# Patient Record
Sex: Female | Born: 1935 | Race: White | Hispanic: No | Smoking: Former smoker
Health system: Southern US, Community
[De-identification: ages and names within clinical notes are randomized; demographics above are authoritative.]

## PROBLEM LIST (undated history)

## (undated) DIAGNOSIS — N179 Acute kidney failure, unspecified: Secondary | ICD-10-CM

## (undated) DIAGNOSIS — I10 Essential (primary) hypertension: Secondary | ICD-10-CM

## (undated) DIAGNOSIS — N189 Chronic kidney disease, unspecified: Secondary | ICD-10-CM

## (undated) DIAGNOSIS — G309 Alzheimer's disease, unspecified: Secondary | ICD-10-CM

## (undated) DIAGNOSIS — K469 Unspecified abdominal hernia without obstruction or gangrene: Secondary | ICD-10-CM

## (undated) DIAGNOSIS — J189 Pneumonia, unspecified organism: Secondary | ICD-10-CM

## (undated) DIAGNOSIS — M109 Gout, unspecified: Secondary | ICD-10-CM

## (undated) DIAGNOSIS — F0280 Dementia in other diseases classified elsewhere without behavioral disturbance: Secondary | ICD-10-CM

## (undated) DIAGNOSIS — R000 Tachycardia, unspecified: Secondary | ICD-10-CM

## (undated) HISTORY — PX: COLON SURGERY: SHX602

## (undated) HISTORY — PX: ABDOMINAL HYSTERECTOMY: SHX81

## (undated) HISTORY — PX: STOMACH SURGERY: SHX791

## (undated) HISTORY — PX: CHOLECYSTECTOMY: SHX55

---

## 2005-06-08 ENCOUNTER — Inpatient Hospital Stay (HOSPITAL_COMMUNITY): Admission: EM | Admit: 2005-06-08 | Discharge: 2005-06-15 | Payer: Self-pay | Admitting: Emergency Medicine

## 2006-07-20 ENCOUNTER — Ambulatory Visit (HOSPITAL_COMMUNITY): Admission: RE | Admit: 2006-07-20 | Discharge: 2006-07-20 | Payer: Self-pay | Admitting: Ophthalmology

## 2006-09-01 ENCOUNTER — Ambulatory Visit (HOSPITAL_COMMUNITY): Admission: RE | Admit: 2006-09-01 | Discharge: 2006-09-01 | Payer: Self-pay | Admitting: Ophthalmology

## 2008-05-07 ENCOUNTER — Ambulatory Visit (HOSPITAL_COMMUNITY): Payer: Self-pay | Admitting: Psychiatry

## 2008-12-09 ENCOUNTER — Emergency Department (HOSPITAL_COMMUNITY): Admission: EM | Admit: 2008-12-09 | Discharge: 2008-12-09 | Payer: Self-pay | Admitting: Emergency Medicine

## 2010-04-22 ENCOUNTER — Ambulatory Visit: Payer: Self-pay | Admitting: Cardiology

## 2010-11-02 ENCOUNTER — Ambulatory Visit (INDEPENDENT_AMBULATORY_CARE_PROVIDER_SITE_OTHER): Payer: Medicare Other | Admitting: Internal Medicine

## 2010-11-02 DIAGNOSIS — R159 Full incontinence of feces: Secondary | ICD-10-CM

## 2011-01-01 NOTE — H&P (Signed)
NAMETERRIYAH, Tanya Santana NO.:  [id number]   MEDICAL RECORD NO.:  [id number]          PATIENT TYPE:  INP   LOCATION:  1824                         FACILITY:  [facility]   PHYSICIAN:  Sandria Bales. Ezzard Standing, M.D.  DATE OF BIRTH:  [birth year]   DATE OF ADMISSION:  06/08/2005  DATE OF DISCHARGE:                                HISTORY & PHYSICAL   HISTORY OF ILLNESS:  This is a 77 year old white female who is a patient of  [facility] whose primary doctor is Dr. Doyne Keel of [place],  presented to the [facility] Emergency Room with an acute upper GI bleed.  She has been seen by Dr. 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   Of interest, the patient actually was hospitalized at [facility] in June 2006 and July 2006 with an upper GI bleed.  Apparently her  stomach was ruptured when they were doing an upper endoscopy and she  required emergency surgery and apparently they thought controlled the  bleeding.  She has had no followup with [facility] for the surgery.  The  daughters who are with her could not remember the name of the surgeon or  managing doctors at [facility], have no records from [facility]  of her prior surgery.   PAST MEDICAL HISTORY:   ALLERGIES:  1.  ERYTHROMYCIN.  2.  PENICILLIN.  3.  SULFA.  She thinks she has had erythromycin within the last five years and has had  hives.  The other two are remote.   REVIEW OF SYSTEMS:  NEUROLOGIC:  Apparently she has had what appeared to  sound like possible TIAs when she has had some drooling and some changes in  her mentation.  She has also had what sounds like early to mild Alzheimer  disease, at least has been diagnosed as such.  PULMONARY:  She smokes a pack of cigarettes a day.  She has had some lung  trouble but again is a fairly constant smoker.  CARDIAC:  She has hypertension for a number of years, but she has never had  angina or cardiac catheterization.  GASTROINTESTINAL:  Again, she has had the prior upper abdominal surgery just  this past summer.  She had an open cholecystectomy many years ago.  According to the daughters, Dr. Cleotis Nipper removed a cyst from her abdominal  wall in the last several years.  UROLOGIC:  She has had no history of kidney stones or kidney infection.  MUSCULOSKELETAL:  She has a history of arthritis.   She has three daughters at her bedside of whom I got the history.  The  patient herself really could not give my any history.  The three daughters  were Genia Del who apparently has the power-of-attorney, Montine Circle, and Vernie Murders and I spoke to them about what is going on.   The medicines that I have include:  1.  Seroquel 25 mg b.i.d.  2.  Celexa 20 mg at  night, q.h.s.  3.  Vicodin, she takes for pain.  4.  Voltaren, unknown dose, I think she is taking that once a day.  5.  Medrol dose pack for unknown reasons.  6.  Aricept 10 mg daily.  7.  Protonix 40 mg daily.  8.  Baby aspirin 81 mg daily.  9.  Hydrochlorothiazide 25 mg daily.  10. Abilify 10 mg b.i.d.   She is separated or divorced from her husband.  She has progressive  Alzheimer's, is unable to give any history.  She has also had some medicines  for this upper endoscopy.   PHYSICAL EXAMINATION:  VITAL SIGNS:  Her temperature is 97.7, pulse 108,  blood pressure 122/61.  She is sating about 97%.  GENERAL:  She is alert, responsive.  NECK:  Supple.  LUNGS:  Clear to auscultation.  HEART:  Regular rate and rhythm.  ABDOMEN:  Shows a well healed midline incision, a right subcostal incision.  She is mildly obese but she has no palpable mass, tenderness, or guarding  that I can find.  EXTREMITIES:  She moves her upper and lower extremities.  NEUROLOGIC:  Grossly intact.   DIAGNOSES:  1.  Upper gastrointestinal bleed,  probably secondary to Dieulafoy's,      controlled right now by upper endoscopy by Dr. Wandra Mannan.  We will      follow with him.  He does think it is probably worthwhile repeating the      upper endoscopy if she has another bleed.  Certainly surgery on her      would be a high risk procedure because of her age, because of her prior      abdominal surgery, and her multiple medical conditions.  The daughters      understand this is a life-threatening illness but at least at this time      it appears to be controlled.  2.  Progressive Alzheimer's.  3.  Possible transient ischemic attacks.  It is unclear how much of a      workup.  4.  History of heavy smoking.  5.  Hypertension.  6.  History of arthritis.  7.  Medrol dose pack for unknown reason.      Sandria Bales. Ezzard Standing, M.D.  Electronically Signed     DHN/MEDQ  D:  06/08/2005  T:  06/08/2005  Job:  [id number]   cc:   Althea Grimmer. Luther Parody, M.D.  Fax: [phone]   Doyne Keel, Dr.  Jonita Albee, [place]

## 2011-01-01 NOTE — H&P (Signed)
NAMETACHINA, SPOONEMORE NO.:  [id number]   MEDICAL RECORD NO.:  [id number]          PATIENT TYPE:  INP   LOCATION:  1824                         FACILITY:  [facility]   PHYSICIAN:  Althea Grimmer. Santogade, M.D.DATE OF BIRTH:  Dec 02, 1926   DATE OF ADMISSION:  06/08/2005  DATE OF DISCHARGE:                                HISTORY & PHYSICAL   Tanya Santana is a 77 year old female admitted through the emergency room to  [facility] in transfer from [facility] with an acute upper  GI bleed characterized by hematemesis and melena.  In the emergency room at  [facility], she was tachycardic to 124 but not hypotensive.  Her BUN was 34.  Hemoglobin 13.3, white blood count 24.1.  She says she has mild epigastric  pain.  She has a history in June of this year of having an upper GI bleed.  The patient has mild vascular dementia and cannot really give me much  history.  Her daughters do not understand exactly what the diagnosis was  this summer; however, at the time of the endoscopy, they say there was a  vessel that ruptured high in the stomach near the esophagus, and the patient  was taken to [facility] and underwent surgery.  Recently she has been  treated with Voltaren, Medrol, and 81 mg aspirin at the nursing home where  she resides. She has been on Protonix as well.   PAST MEDICAL HISTORY:  Pertinent for:  1.  Vascular dementia.  2.  Hypertension.  3.  B12 deficiency.  4.  Gout.  5.  Reported reflux.   CURRENT MEDICATIONS:  1.  Voltaren.  2.  Medrol Dosepak.  3.  Aspirin 81 mg  4.  Vitamin D 400 mg.  5.  Vitamin B12 1000 mcg per day.  6.  Nu-Iron.  7.  Caltrate D.  8.  Aricept.  9.  Protonix 40 mg daily.  10. Abilify.   For the remainder of her medication list, please see the list which is being  forwarded from the nursing home.   ALLERGIES:  PENICILLIN, ERYTHROMYCIN, and BACTRIM.   FAMILY HISTORY:  Noncontributory.   SOCIAL HISTORY:  Resident of a  nursing home.  Nonsmoker, nondrinker.   REVIEW OF SYSTEMS:  Otherwise not obtainable.   PHYSICAL EXAMINATION:  VITAL SIGNS:  She is afebrile.  Blood pressure  116/79, pulse 124.  HEENT:  Eyes are anicteric. Conjunctivae appear pink.  Oropharynx  unremarkable.  NECK:  Supple without thyromegaly.  CHEST: Sounds clear anteriorly.  HEART: Sounds tachycardic.  ABDOMEN: Overweight and soft with bowel sounds present.  There is mild  tenderness in the epigastrium to deep palpation.  RECTAL:  Exam revealed melena.  EXTREMITIES: 1+ edema.  NG lavage reveals coffee ground material.   IMPRESSION:  Acute upper gastrointestinal bleed of unclear etiology,  possibly related to the combination of steroids, nonsteroidals, and aspirin  which could cause ulceration or gastritis.   PLAN:  Urgent endoscopy is in order.   ADDENDUM:  The upper endoscopy revealed an arterial bleeding lesion in the  cardia of the stomach along the lesser  curve consistent with a Dieulafoy  lesion.  This was injected with epinephrine and clipped twice with  hemostasis.  No other lesions were noted.  The patient will be admitted to  the ICU and monitored with serial hemoglobins, transfused as necessary, and  placed on a Protonix drip.  Consideration will be given to repeating the  endoscopy to clarify this problem and to try and understand the relationship  to her prior upper GI bleed earlier this summer.  Records will be requested  from [facility].      Althea Grimmer. Luther Parody, M.D.  Electronically Signed     PJS/MEDQ  D:  06/08/2005  T:  06/08/2005  Job:  [id number]   cc:   Shirley Friar, MD  Fax: [phone]   Wende Crease, M.D.  [employer]

## 2011-01-01 NOTE — Discharge Summary (Signed)
Tanya Santana, PERRIS NO.:  [id number]   MEDICAL RECORD NO.:  [id number]          PATIENT TYPE:  INP   LOCATION:  6737                         FACILITY:  [facility]   PHYSICIAN:  Tanya Santana, MDDATE OF BIRTH:  [birth year]   DATE OF ADMISSION:  06/08/2005  DATE OF DISCHARGE:  06/14/2005                                 DISCHARGE SUMMARY   DISCHARGE DIAGNOSES:  1.  Dieulafoy lesion in stomach with bleeding.  2.  History of vascular dementia.  3.  History of hypertension.  4.  History of B12 deficiency.  5.  History of gout.  6.  History of reflux.   DISCHARGE MEDICATIONS:  1.  Protonix 40 mg p.o. b.i.d. x2 weeks and the p.o. daily.  2.  Abilify 10 mg b.i.d.  3.  Celexa 20 mg q.h.s.  4.  Aricept 10 mg q.h.s.  5.  Hydrochlorothiazide 25 mg daily.  6.  Nuiron 150 mg daily.  7.  Vitamin B12 1000 mcg daily.  8.  Vitamin D 400 IU daily.   DIET:  No restrictions.   ACTIVITY:  Walk with assistance if needed.   HOSPITAL COURSE:  The patient was admitted on June 08, 2005 with acute  upper GI bleed noted by hematemesis and melena.  She was tachycardic on  admission but not hypotensive.  Her BUN was 34, and her hemoglobin was 13.  The patient had a similar episode in July of 2006 and was found to have a  vascular anomaly in the fundus with active bleeding that was hemoclipped at  that time.   During this hospitalization, the patient underwent an urgent upper endoscopy  which revealed an active arterial bleed from a Dieulafoy lesion in the  fundus of the stomach.  This lesion was hemoclipped x2, and 9 cc of  epinephrine, 1:10,000, was injected circumferentially around the lesion.  Successful hemostasis was obtained, and the patient's hemoglobin remained  stable post-procedure.  This procedure was done on June 08, 2005.  She  had a nadir for her hemoglobin of 9.1, and her hemoglobin remained around  between 9 and 10.  The patient's melena resolved, and she  had no additional  hematemesis post-procedure.  She was initially placed on a high- dose  Protonix continuous infusion, and this was continued until the patient had  no further signs of bleeding.  She was then transitioned over to p.o.  Protonix twice a day, and she should remain on that for two weeks post-  discharge and then transition to once a day Protonix.   Surgery saw her during her hospitalization due to this being a second  arterial bleed she has had in the fundus of her stomach, and since her  hemoglobin remained stable without any further bleeding, no surgical  intervention was necessary.   The patient was a resident of [place] and is a candidate to go back  there from a medical standpoint, but at the time of this dictation, a  representative from [facility] is scheduled to visit Tanya Santana in the  hospital on June 14, 2005 to clear her for transfer back  there.   CONSULTATIONS:  General surgery.   FOLLOW UP:  1.  The patient should return to see Dr. Doyne Santana in [place] two to four weeks      after discharge.  2.  The patient should return to see Dr. Bosie Santana at [facility] as needed.      The phone number for my office is [phone].   SPECIAL INSTRUCTIONS:  Would avoid all NSAIDs, except as noted.  No aspirin  for two weeks and then resume 81 mg p.o. daily unless further changes by Dr.  Doyne Santana.      Tanya Friar, MD  Electronically Signed     VCS/MEDQ  D:  06/13/2005  T:  06/13/2005  Job:  [id number]   cc:   Tanya Santana, M.D.  [place], [place]

## 2012-07-14 ENCOUNTER — Emergency Department: Payer: Self-pay | Admitting: Emergency Medicine

## 2012-07-14 LAB — CBC
HCT: 38.2 % (ref 35.0–47.0)
HGB: 13 g/dL (ref 12.0–16.0)
MCH: 32.1 pg (ref 26.0–34.0)
Platelet: 184 10*3/uL (ref 150–440)
RBC: 4.06 10*6/uL (ref 3.80–5.20)
WBC: 6.5 10*3/uL (ref 3.6–11.0)

## 2012-07-14 LAB — URINALYSIS, COMPLETE
Glucose,UR: NEGATIVE mg/dL (ref 0–75)
Ketone: NEGATIVE
Ph: 6 (ref 4.5–8.0)
Protein: NEGATIVE
Specific Gravity: 1.019 (ref 1.003–1.030)
Squamous Epithelial: 5
WBC UR: 3 /HPF (ref 0–5)

## 2012-07-14 LAB — BASIC METABOLIC PANEL
Anion Gap: 6 — ABNORMAL LOW (ref 7–16)
BUN: 19 mg/dL — ABNORMAL HIGH (ref 7–18)
Calcium, Total: 8.6 mg/dL (ref 8.5–10.1)
Creatinine: 1.18 mg/dL (ref 0.60–1.30)
EGFR (African American): 49 — ABNORMAL LOW
EGFR (Non-African Amer.): 43 — ABNORMAL LOW
Glucose: 100 mg/dL — ABNORMAL HIGH (ref 65–99)
Osmolality: 282 (ref 275–301)
Sodium: 140 mmol/L (ref 136–145)

## 2013-01-24 ENCOUNTER — Emergency Department (HOSPITAL_COMMUNITY): Payer: Medicare Other

## 2013-01-24 ENCOUNTER — Emergency Department (HOSPITAL_COMMUNITY)
Admission: EM | Admit: 2013-01-24 | Discharge: 2013-01-24 | Disposition: A | Discharge status: Home / Self Care | Payer: Medicare Other | Attending: Emergency Medicine | Admitting: Emergency Medicine

## 2013-01-24 ENCOUNTER — Encounter (HOSPITAL_COMMUNITY): Payer: Self-pay | Admitting: Emergency Medicine

## 2013-01-24 DIAGNOSIS — Z8719 Personal history of other diseases of the digestive system: Secondary | ICD-10-CM | POA: Insufficient documentation

## 2013-01-24 DIAGNOSIS — Y9210 Unspecified residential institution as the place of occurrence of the external cause: Secondary | ICD-10-CM | POA: Insufficient documentation

## 2013-01-24 DIAGNOSIS — Y9389 Activity, other specified: Secondary | ICD-10-CM | POA: Insufficient documentation

## 2013-01-24 DIAGNOSIS — I129 Hypertensive chronic kidney disease with stage 1 through stage 4 chronic kidney disease, or unspecified chronic kidney disease: Secondary | ICD-10-CM | POA: Insufficient documentation

## 2013-01-24 DIAGNOSIS — M109 Gout, unspecified: Secondary | ICD-10-CM | POA: Insufficient documentation

## 2013-01-24 DIAGNOSIS — Z79899 Other long term (current) drug therapy: Secondary | ICD-10-CM | POA: Insufficient documentation

## 2013-01-24 DIAGNOSIS — W08XXXA Fall from other furniture, initial encounter: Secondary | ICD-10-CM | POA: Insufficient documentation

## 2013-01-24 DIAGNOSIS — G309 Alzheimer's disease, unspecified: Secondary | ICD-10-CM | POA: Insufficient documentation

## 2013-01-24 DIAGNOSIS — F0280 Dementia in other diseases classified elsewhere without behavioral disturbance: Secondary | ICD-10-CM | POA: Insufficient documentation

## 2013-01-24 DIAGNOSIS — Z8701 Personal history of pneumonia (recurrent): Secondary | ICD-10-CM | POA: Insufficient documentation

## 2013-01-24 DIAGNOSIS — N189 Chronic kidney disease, unspecified: Secondary | ICD-10-CM | POA: Insufficient documentation

## 2013-01-24 DIAGNOSIS — Z880 Allergy status to penicillin: Secondary | ICD-10-CM | POA: Insufficient documentation

## 2013-01-24 DIAGNOSIS — S0003XA Contusion of scalp, initial encounter: Secondary | ICD-10-CM | POA: Insufficient documentation

## 2013-01-24 DIAGNOSIS — Z8679 Personal history of other diseases of the circulatory system: Secondary | ICD-10-CM | POA: Insufficient documentation

## 2013-01-24 HISTORY — DX: Unspecified abdominal hernia without obstruction or gangrene: K46.9

## 2013-01-24 HISTORY — DX: Chronic kidney disease, unspecified: N18.9

## 2013-01-24 HISTORY — DX: Pneumonia, unspecified organism: J18.9

## 2013-01-24 HISTORY — DX: Gout, unspecified: M10.9

## 2013-01-24 HISTORY — DX: Dementia in other diseases classified elsewhere, unspecified severity, without behavioral disturbance, psychotic disturbance, mood disturbance, and anxiety: F02.80

## 2013-01-24 HISTORY — DX: Essential (primary) hypertension: I10

## 2013-01-24 HISTORY — DX: Tachycardia, unspecified: R00.0

## 2013-01-24 HISTORY — DX: Alzheimer's disease, unspecified: G30.9

## 2013-01-24 LAB — CBC WITH DIFFERENTIAL/PLATELET
Basophils Absolute: 0 10*3/uL (ref 0.0–0.1)
Basophils Relative: 1 % (ref 0–1)
Eosinophils Absolute: 0.1 10*3/uL (ref 0.0–0.7)
Eosinophils Relative: 3 % (ref 0–5)
HCT: 36.7 % (ref 36.0–46.0)
MCH: 31.3 pg (ref 26.0–34.0)
MCHC: 34.1 g/dL (ref 30.0–36.0)
MCV: 91.8 fL (ref 78.0–100.0)
Monocytes Absolute: 0.4 10*3/uL (ref 0.1–1.0)
Neutro Abs: 2.7 10*3/uL (ref 1.7–7.7)
RDW: 13.5 % (ref 11.5–15.5)

## 2013-01-24 LAB — BASIC METABOLIC PANEL
BUN: 21 mg/dL (ref 6–23)
Calcium: 9 mg/dL (ref 8.4–10.5)
Creatinine, Ser: 1.06 mg/dL (ref 0.50–1.10)
GFR calc Af Amer: 54 mL/min — ABNORMAL LOW (ref >90)
GFR calc non Af Amer: 47 mL/min — ABNORMAL LOW (ref >90)

## 2013-01-24 NOTE — ED Provider Notes (Signed)
History    CSN: [id number] Arrival date & time 01/24/13  1944 First MD Initiated Contact with Patient 01/24/13 2000     Chief Complaint  Patient presents with  . Fall  . Head Injury    HPI Patient presents to the emergency room after a fall this evening at the nursing facility. Her pulse with this but the staff suspects that she was trying to transfer herself to a chair but she lost her balance and fell down. Patient has a hematoma to the back of her head.  Patient currently denies any complaints although she mentioned a headache earlier. She denies any chest pain or shortness of breath. She does have history of dementia so it is difficult any further history. Past Medical History  Diagnosis Date  . Hypertension   . Alzheimer's dementia   . Pneumonia   . Gout   . Tachycardia   . Chronic renal insufficiency   . Hernia     Past Surgical History  Procedure Laterality Date  . Cholecystectomy    . Abdominal hysterectomy    . Colon surgery    . Stomach surgery      History reviewed. No pertinent family history.  History  Substance Use Topics  . Smoking status: Never Smoker   . Smokeless tobacco: Never Used  . Alcohol Use: No    OB History   Grav Para Term Preterm Abortions TAB SAB Ect Mult Living            3      Review of Systems  All other systems reviewed and are negative.    Allergies  Cephalosporins; Macrolides and ketolides; and Penicillins  Home Medications   Current Outpatient Rx  Name  Route  Sig  Dispense  Refill  . acetaminophen (TYLENOL) 500 MG tablet   Oral   Take 500 mg by mouth every 4 (four) hours as needed for pain.         Marland Kitchen alum & mag hydroxide-simeth (MAALOX/MYLANTA) 200-200-20 MG/5ML suspension   Oral   Take 30 mLs by mouth every 6 (six) hours as needed for indigestion.         Marland Kitchen amLODipine (NORVASC) 5 MG tablet   Oral   Take 5 mg by mouth daily.         . Artificial Tear Ointment (ARTIFICIAL TEARS) ointment   Both Eyes    Place 1 application into both eyes 2 (two) times daily.         . calcium carbonate (OS-CAL) 600 MG TABS   Oral   Take 600 mg by mouth 2 (two) times daily with a meal.         . divalproex (DEPAKOTE ER) 250 MG 24 hr tablet   Oral   Take 250 mg by mouth every 8 (eight) hours.         Marland Kitchen FLUoxetine (PROZAC) 10 MG tablet   Oral   Take 10 mg by mouth daily.         Marland Kitchen guaiFENesin (ROBITUSSIN) 100 MG/5ML liquid   Oral   Take 200 mg by mouth 3 (three) times daily as needed for cough.         . loperamide (IMODIUM A-D) 2 MG tablet   Oral   Take 2 mg by mouth 4 (four) times daily as needed for diarrhea or loose stools.         Marland Kitchen LORazepam (ATIVAN PO)   Topical   Apply 0.5-1 mg topically every 6 (  six) hours as needed (agitation and unable to take oral form).         . LORazepam (ATIVAN) 0.5 MG tablet   Oral   Take 0.5 mg by mouth every 8 (eight) hours.         . magnesium hydroxide (MILK OF MAGNESIA) 400 MG/5ML suspension   Oral   Take 30 mLs by mouth daily as needed for constipation.         . mirtazapine (REMERON) 45 MG tablet   Oral   Take 45 mg by mouth at bedtime.         . nicotine (NICODERM CQ - DOSED IN MG/24 HOURS) 14 mg/24hr patch   Transdermal   Place 1 patch onto the skin daily.         Marland Kitchen OLANZapine (ZYPREXA) 2.5 MG tablet   Oral   Take 2.5 mg by mouth at bedtime.         Marland Kitchen OLANZapine (ZYPREXA) 7.5 MG tablet   Oral   Take 7.5 mg by mouth at bedtime.         Marland Kitchen omeprazole (PRILOSEC) 20 MG capsule   Oral   Take 20 mg by mouth 2 (two) times daily.         . polyvinyl alcohol-povidone (HYPOTEARS) 1.4-0.6 % ophthalmic solution   Both Eyes   Place 1 drop into both eyes every 6 (six) hours. Wait 3-5 minutes between drops         . potassium chloride SA (K-DUR,KLOR-CON) 20 MEQ tablet   Oral   Take 20 mEq by mouth daily.         . Probiotic Product (RISA-BID PROBIOTIC PO)   Oral   Take 1 tablet by mouth daily.         .  rivastigmine (EXELON) 4.6 mg/24hr   Transdermal   Place 1 patch onto the skin daily.         . vitamin B-12 (CYANOCOBALAMIN) 1000 MCG tablet   Oral   Take 1,000 mcg by mouth daily.           BP 117/51  Pulse 91  Temp(Src) 97.9 F (36.6 C) (Oral)  Resp 21  SpO2 93%  Physical Exam  Nursing note and vitals reviewed. Constitutional: She appears well-developed and well-nourished. No distress.  HENT:  Head: Normocephalic.  Right Ear: External ear normal.  Left Ear: External ear normal.  Cephalad hematoma posterior occiput  Eyes: Conjunctivae are normal. Right eye exhibits no discharge. Left eye exhibits no discharge. No scleral icterus.  Neck: Neck supple. No tracheal deviation present.  Cardiovascular: Normal rate, regular rhythm and intact distal pulses.   Pulmonary/Chest: Effort normal and breath sounds normal. No stridor. No respiratory distress. She has no wheezes. She has no rales.  Abdominal: Soft. Bowel sounds are normal. She exhibits no distension. There is no tenderness. There is no rebound and no guarding.  Musculoskeletal: She exhibits no edema and no tenderness.  Full range of motion all 4 extremities without pain or discomfort, no tenderness to palpation of cervical thoracic or lumbar spine  Neurological: She is alert. She has normal strength. No sensory deficit. Cranial nerve deficit:  no gross defecits noted. She exhibits normal muscle tone. She displays no seizure activity. Coordination normal.  Skin: Skin is warm and dry. No rash noted.  Psychiatric: She has a normal mood and affect.    ED Course  Procedures (including critical care time) EKG Normal sinus rhythm rate 96 Normal axis Right bundle  branch block Normal ST-T wave segment No prior EKG for comparison Labs Reviewed  BASIC METABOLIC PANEL - Abnormal; Notable for the following:    Glucose, Bld 128 (*)    GFR calc non Af Amer 47 (*)    GFR calc Af Amer 54 (*)    All other components within normal  limits  CBC WITH DIFFERENTIAL   Ct Head Wo Contrast  01/24/2013   *RADIOLOGY REPORT*  Clinical Data:  Fall.  Trauma to back of head.  Hematoma.  CT HEAD WITHOUT CONTRAST CT CERVICAL SPINE WITHOUT CONTRAST  Technique:  Multidetector CT imaging of the head and cervical spine was performed following the standard protocol without intravenous contrast.  Multiplanar CT image reconstructions of the cervical spine were also generated.  Comparison:  CT head without contrast 12/03/2011.  CT head and cervical spine 08/10/2011.  CT HEAD  Findings: A small left parietal scalp hematoma is noted.  There is no underlying fracture.  Atrophy and diffuse white matter disease is not significantly changed.  No acute cortical focal infarct, hemorrhage, or mass lesion is present.  Remote lacunar infarcts of the basal ganglia bilaterally are stable.  The ventricles are proportionate to the degree of atrophy.  No significant extra-axial fluid collection is present.  The paranasal sinuses and mastoid air cells are clear.  IMPRESSION:  1.  Left parietal scalp hematoma without an underlying fracture. 2.  Stable atrophy and diffuse white matter disease.  CT CERVICAL SPINE  Findings: The cervical spine is imaged from the skull base through T2.  Vertebral body heights and alignment are maintained. Degenerative changes are most evident at C3-4, C5-6, and C6-7, as before.  No acute fracture or traumatic subluxation is evident.  The lung apices are clear.  Heterogeneous enlargement of the thyroid with right-sided calcified nodules is similar to the prior study.  IMPRESSION:  1.  Stable moderate spondylosis of the cervical spine. 2.  No acute abnormality. 3.  Stable right-sided thyroid nodules.   Original Report Authenticated By: Marin Roberts, M.D.   Ct Cervical Spine Wo Contrast  01/24/2013   *RADIOLOGY REPORT*  Clinical Data:  Fall.  Trauma to back of head.  Hematoma.  CT HEAD WITHOUT CONTRAST CT CERVICAL SPINE WITHOUT CONTRAST   Technique:  Multidetector CT imaging of the head and cervical spine was performed following the standard protocol without intravenous contrast.  Multiplanar CT image reconstructions of the cervical spine were also generated.  Comparison:  CT head without contrast 12/03/2011.  CT head and cervical spine 08/10/2011.  CT HEAD  Findings: A small left parietal scalp hematoma is noted.  There is no underlying fracture.  Atrophy and diffuse white matter disease is not significantly changed.  No acute cortical focal infarct, hemorrhage, or mass lesion is present.  Remote lacunar infarcts of the basal ganglia bilaterally are stable.  The ventricles are proportionate to the degree of atrophy.  No significant extra-axial fluid collection is present.  The paranasal sinuses and mastoid air cells are clear.  IMPRESSION:  1.  Left parietal scalp hematoma without an underlying fracture. 2.  Stable atrophy and diffuse white matter disease.  CT CERVICAL SPINE  Findings: The cervical spine is imaged from the skull base through T2.  Vertebral body heights and alignment are maintained. Degenerative changes are most evident at C3-4, C5-6, and C6-7, as before.  No acute fracture or traumatic subluxation is evident.  The lung apices are clear.  Heterogeneous enlargement of the thyroid with right-sided calcified nodules is  similar to the prior study.  IMPRESSION:  1.  Stable moderate spondylosis of the cervical spine. 2.  No acute abnormality. 3.  Stable right-sided thyroid nodules.   Original Report Authenticated By: Marin Roberts, M.D.     1. Scalp hematoma, initial encounter       MDM  Patient is without signs of any serious injury. Discharged back to the nursing facility. Suspect this was a mechanical fall and not a syncopal event        Celene Kras, MD 01/24/13 2159

## 2013-01-24 NOTE — ED Notes (Signed)
Brought in by EMS from [facility] facility after her fall tonight.  Per EMS, pt was trying to transfer self to a chair but lost her balance and fell down on her buttocks and hit head on the wall, sustaining a moderate hematoma to back of head; pt is not on a any blood thinner medication; pt has had no loss of consciousness.  Pt presents to ED alert and verbal, in no s/s apparent distress.

## 2013-01-24 NOTE — ED Notes (Signed)
[id number]> Expected date:01/24/13<BR> Expected time: 7:31 PM<BR> Means of arrival:Ambulance<BR> Comments:<BR> fall

## 2013-01-24 NOTE — Discharge Instructions (Signed)
Head Injury, Adult  A head injury happens when the head is hit really hard. A head injury may cause sleepiness, headache, throwing up (vomiting), and problems seeing. If the head injury is really bad, you may need to stay in the hospital.  HOME CARE  · Have someone with you for the first 24 hours. This person should wake you up every 1 hour to check on your condition.  · Only drink water or clear fluid for the rest of the day. Then, go back to your regular diet.  · Only take medicines as told by your doctor. Do not take aspirin.  · Do not drink alcohol for 2 days.  · Do not take medicines that help your relax (sedatives) for 2 days.  Side effects may happen for up to 7 to 10 days. Watch for new problems.  GET HELP RIGHT AWAY IF:   · You are confused or sleepy.  · You cannot be woken up.  · You feel sick to your stomach (nauseous) or keep throwing up.  · Your dizziness or unsteadiness is get worse, or your cannot walk.  · You start to shake (convulse) or pass out (faint).  · You have very bad, lasting headaches that are not helped by medicine.  · You cannot use your arms or legs like normal.  · You have clear or bloody fluid coming from your nose or ears.  MAKE SURE YOU:   · Understand these instructions.  · Will watch your condition.  · Will get help right away if you are not doing well or get worse.  Document Released: 07/15/2008 Document Revised: 10/25/2011 Document Reviewed: 06/18/2009  [employer]® Patient Information ©2014 [employer].

## 2013-01-28 ENCOUNTER — Emergency Department (HOSPITAL_COMMUNITY)
Admission: EM | Admit: 2013-01-28 | Discharge: 2013-01-29 | Disposition: A | Discharge status: Home / Self Care | Payer: Medicare Other | Attending: Emergency Medicine | Admitting: Emergency Medicine

## 2013-01-28 ENCOUNTER — Emergency Department (HOSPITAL_COMMUNITY): Payer: Medicare Other

## 2013-01-28 DIAGNOSIS — Z880 Allergy status to penicillin: Secondary | ICD-10-CM | POA: Insufficient documentation

## 2013-01-28 DIAGNOSIS — M25559 Pain in unspecified hip: Secondary | ICD-10-CM | POA: Insufficient documentation

## 2013-01-28 DIAGNOSIS — Z23 Encounter for immunization: Secondary | ICD-10-CM | POA: Insufficient documentation

## 2013-01-28 DIAGNOSIS — Y9210 Unspecified residential institution as the place of occurrence of the external cause: Secondary | ICD-10-CM | POA: Insufficient documentation

## 2013-01-28 DIAGNOSIS — I129 Hypertensive chronic kidney disease with stage 1 through stage 4 chronic kidney disease, or unspecified chronic kidney disease: Secondary | ICD-10-CM | POA: Insufficient documentation

## 2013-01-28 DIAGNOSIS — G309 Alzheimer's disease, unspecified: Secondary | ICD-10-CM | POA: Insufficient documentation

## 2013-01-28 DIAGNOSIS — Z8639 Personal history of other endocrine, nutritional and metabolic disease: Secondary | ICD-10-CM | POA: Insufficient documentation

## 2013-01-28 DIAGNOSIS — R296 Repeated falls: Secondary | ICD-10-CM | POA: Insufficient documentation

## 2013-01-28 DIAGNOSIS — Z79899 Other long term (current) drug therapy: Secondary | ICD-10-CM | POA: Insufficient documentation

## 2013-01-28 DIAGNOSIS — Y9389 Activity, other specified: Secondary | ICD-10-CM | POA: Insufficient documentation

## 2013-01-28 DIAGNOSIS — Z8701 Personal history of pneumonia (recurrent): Secondary | ICD-10-CM | POA: Insufficient documentation

## 2013-01-28 DIAGNOSIS — IMO0002 Reserved for concepts with insufficient information to code with codable children: Secondary | ICD-10-CM | POA: Insufficient documentation

## 2013-01-28 DIAGNOSIS — F0280 Dementia in other diseases classified elsewhere without behavioral disturbance: Secondary | ICD-10-CM | POA: Insufficient documentation

## 2013-01-28 DIAGNOSIS — Y92009 Unspecified place in unspecified non-institutional (private) residence as the place of occurrence of the external cause: Secondary | ICD-10-CM

## 2013-01-28 DIAGNOSIS — Z862 Personal history of diseases of the blood and blood-forming organs and certain disorders involving the immune mechanism: Secondary | ICD-10-CM | POA: Insufficient documentation

## 2013-01-28 DIAGNOSIS — Z8719 Personal history of other diseases of the digestive system: Secondary | ICD-10-CM | POA: Insufficient documentation

## 2013-01-28 DIAGNOSIS — N189 Chronic kidney disease, unspecified: Secondary | ICD-10-CM | POA: Insufficient documentation

## 2013-01-28 DIAGNOSIS — N390 Urinary tract infection, site not specified: Secondary | ICD-10-CM | POA: Insufficient documentation

## 2013-01-28 LAB — URINALYSIS, ROUTINE W REFLEX MICROSCOPIC
Ketones, ur: NEGATIVE mg/dL
Nitrite: POSITIVE — AB
Protein, ur: NEGATIVE mg/dL

## 2013-01-28 LAB — URINE MICROSCOPIC-ADD ON

## 2013-01-28 LAB — POCT I-STAT, CHEM 8
BUN: 20 mg/dL (ref 6–23)
Calcium, Ion: 1.21 mmol/L (ref 1.13–1.30)
Creatinine, Ser: 1 mg/dL (ref 0.50–1.10)
TCO2: 23 mmol/L (ref 0–100)

## 2013-01-28 MED ORDER — TETANUS-DIPHTH-ACELL PERTUSSIS 5-2.5-18.5 LF-MCG/0.5 IM SUSP
0.5000 mL | Freq: Once | INTRAMUSCULAR | Status: AC
  Administered 2013-01-29: 0.5 mL via INTRAMUSCULAR
  Filled 2013-01-28: qty 0.5

## 2013-01-28 NOTE — ED Notes (Signed)
[id number]<[id number]> Expected date:<BR> Expected time:<BR> Means of arrival:<BR> Comments:<BR> EMS

## 2013-01-28 NOTE — ED Notes (Signed)
Patient transported to X-ray 

## 2013-01-28 NOTE — ED Notes (Signed)
MD at bedside. 

## 2013-01-28 NOTE — ED Provider Notes (Signed)
History     CSN: [id number]  Arrival date & time 01/28/13  2134   First MD Initiated Contact with Patient 01/28/13 2215      Chief Complaint  Patient presents with  . Fall   level V caveat-dementia (Consider location/radofiatio/duratio and and andn/timing/severity/associated sxs/prior treatment) HPI  patient fell while using her walker tonight at her assisted-living facility, memory care unit. .She denies complaint presently. She has fallen 3 times in the past week per her daughter. She suffered abrasions to her left leg and right arm as result of a fall. No treatment prior to coming here S. Past Medical History  Diagnosis Date  . Hypertension   . Alzheimer's dementia   . Pneumonia   . Gout   . Tachycardia   . Chronic renal insufficiency   . Hernia     Past Surgical History  Procedure Laterality Date  . Cholecystectomy    . Abdominal hysterectomy    . Colon surgery    . Stomach surgery      No family history on file.  History  Substance Use Topics  . Smoking status: Never Smoker   . Smokeless tobacco: Never Used  . Alcohol Use: No    OB History   Grav Para Term Preterm Abortions TAB SAB Ect Mult Living            3      Review of Systems  Unable to perform ROS: Dementia  Skin: Positive for wound.       abraisions    Allergies  Cephalosporins; Macrolides and ketolides; and Penicillins  Home Medications   Current Outpatient Rx  Name  Route  Sig  Dispense  Refill  . acetaminophen (TYLENOL) 500 MG tablet   Oral   Take 500 mg by mouth every 4 (four) hours as needed for pain.         Marland Kitchen alum & mag hydroxide-simeth (MAALOX/MYLANTA) 200-200-20 MG/5ML suspension   Oral   Take 30 mLs by mouth every 6 (six) hours as needed for indigestion.         Marland Kitchen amLODipine (NORVASC) 5 MG tablet   Oral   Take 5 mg by mouth daily.         . Artificial Tear Ointment (ARTIFICIAL TEARS) ointment   Both Eyes   Place 1 application into both eyes 2 (two) times  daily.         . calcium carbonate (OS-CAL) 600 MG TABS   Oral   Take 600 mg by mouth daily.          . divalproex (DEPAKOTE) 250 MG DR tablet   Oral   Take 250 mg by mouth 3 (three) times daily.         Marland Kitchen FLUoxetine (PROZAC) 10 MG tablet   Oral   Take 10 mg by mouth daily.         Marland Kitchen guaiFENesin (ROBITUSSIN) 100 MG/5ML liquid   Oral   Take 200 mg by mouth 3 (three) times daily as needed for cough.         . loperamide (IMODIUM A-D) 2 MG tablet   Oral   Take 2 mg by mouth 4 (four) times daily as needed for diarrhea or loose stools.         Marland Kitchen LORazepam (ATIVAN PO)   Topical   Apply 0.5-1 mg topically every 6 (six) hours as needed (agitation and unable to take oral form).         Marland Kitchen  LORazepam (ATIVAN) 0.5 MG tablet   Oral   Take 0.5 mg by mouth every morning.          . magnesium hydroxide (MILK OF MAGNESIA) 400 MG/5ML suspension   Oral   Take 30 mLs by mouth daily as needed for constipation.         . mirtazapine (REMERON) 45 MG tablet   Oral   Take 45 mg by mouth at bedtime.         . nicotine (NICODERM CQ - DOSED IN MG/24 HOURS) 14 mg/24hr patch   Transdermal   Place 1 patch onto the skin daily.         Marland Kitchen OLANZapine (ZYPREXA) 2.5 MG tablet   Oral   Take 2.5 mg by mouth every morning.          Marland Kitchen OLANZapine (ZYPREXA) 7.5 MG tablet   Oral   Take 7.5 mg by mouth at bedtime.         Marland Kitchen omeprazole (PRILOSEC) 20 MG capsule   Oral   Take 20 mg by mouth 2 (two) times daily.         . polyvinyl alcohol-povidone (HYPOTEARS) 1.4-0.6 % ophthalmic solution   Both Eyes   Place 1 drop into both eyes every 6 (six) hours. Wait 3-5 minutes between drops         . potassium chloride SA (K-DUR,KLOR-CON) 20 MEQ tablet   Oral   Take 20 mEq by mouth daily.         . Probiotic Product (RISA-BID PROBIOTIC PO)   Oral   Take 1 tablet by mouth daily.         . rivastigmine (EXELON) 4.6 mg/24hr   Transdermal   Place 1 patch onto the skin daily.          . vitamin B-12 (CYANOCOBALAMIN) 1000 MCG tablet   Oral   Take 1,000 mcg by mouth daily.           BP 108/52  Pulse 94  Temp(Src) 98.7 F (37.1 C) (Rectal)  Resp 21  SpO2 91%  Physical Exam  Nursing note and vitals reviewed. Constitutional: She appears well-developed and well-nourished. No distress.  HENT:  Head: Normocephalic and atraumatic.  Right Ear: External ear normal.  Left Ear: External ear normal.  Bilateral tympanic membranes normal  Eyes: Conjunctivae are normal. Pupils are equal, round, and reactive to light.  Neck: Neck supple. No tracheal deviation present. No thyromegaly present.  Cardiovascular: Normal rate and regular rhythm.   No murmur heard. Pulmonary/Chest: Effort normal and breath sounds normal.  Abdominal: Soft. Bowel sounds are normal. She exhibits no distension. There is no tenderness.  Musculoskeletal: Normal range of motion. She exhibits no edema and no tenderness.  Entire spine nontender pelvis stable. She has mild pain on internal rotation of both thighs. Left lower extremity she is tender at anterior knee. There is a dime-sized abrasion at the calf. Right upper extremity is a 2 cm x 1 cm abrasion at the upper arm, posterior aspect no deformity no swelling no point tenderness.  Neurological: She is alert. No cranial nerve deficit. Coordination normal.  Follow simple commands moves all extremities  Skin: Skin is warm and dry. No rash noted.  Psychiatric: She has a normal mood and affect.  Flat affect    ED Course  Procedures (including critical care time)  Labs Reviewed  URINALYSIS, ROUTINE W REFLEX MICROSCOPIC   No results found.   No diagnosis found.  CT scan  reports reviewed by me x-ray report and x-ray of knee viewed by me. Lab data reviewed by me Results for orders placed during the hospital encounter of 01/28/13  URINALYSIS, ROUTINE W REFLEX MICROSCOPIC      Result Value Range   Color, Urine YELLOW  YELLOW   APPearance  CLOUDY (*) CLEAR   Specific Gravity, Urine 1.025  1.005 - 1.030   pH 5.5  5.0 - 8.0   Glucose, UA NEGATIVE  NEGATIVE mg/dL   Hgb urine dipstick NEGATIVE  NEGATIVE   Bilirubin Urine SMALL (*) NEGATIVE   Ketones, ur NEGATIVE  NEGATIVE mg/dL   Protein, ur NEGATIVE  NEGATIVE mg/dL   Urobilinogen, UA 2.0 (*) 0.0 - 1.0 mg/dL   Nitrite POSITIVE (*) NEGATIVE   Leukocytes, UA SMALL (*) NEGATIVE  URINE MICROSCOPIC-ADD ON      Result Value Range   WBC, UA 11-20  <3 WBC/hpf   Bacteria, UA MANY (*) RARE  POCT I-STAT, CHEM 8      Result Value Range   Sodium 147 (*) 135 - 145 mEq/L   Potassium 3.6  3.5 - 5.1 mEq/L   Chloride 112  96 - 112 mEq/L   BUN 20  6 - 23 mg/dL   Creatinine, Ser 4.09  0.50 - 1.10 mg/dL   Glucose, Bld 99  70 - 99 mg/dL   Calcium, Ion 8.11  9.14 - 1.30 mmol/L   TCO2 23  0 - 100 mmol/L   Hemoglobin 11.6 (*) 12.0 - 15.0 g/dL   HCT 78.2 (*) 95.6 - 21.3 %   Ct Head Wo Contrast  01/28/2013   *RADIOLOGY REPORT*  Clinical Data: History of trauma from a fall.  CT HEAD WITHOUT CONTRAST  Technique:  Contiguous axial images were obtained from the base of the skull through the vertex without contrast.  Comparison: Head CT 01/24/2013.  Findings: Moderate cerebral and cerebellar atrophy.  Extensive patchy and confluent areas of decreased attenuation throughout the deep and periventricular white matter of the cerebral hemispheres bilaterally, compatible with advanced chronic microvascular ischemic disease. No acute displaced skull fractures are identified.  No acute intracranial abnormality.  Specifically, no evidence of acute post-traumatic intracranial hemorrhage, no definite regions of acute/subacute cerebral ischemia, no focal mass, mass effect, hydrocephalus or abnormal intra or extra-axial fluid collections.  The visualized paranasal sinuses and mastoids are well pneumatized, with exception of a small left mastoid effusion (unchanged).  IMPRESSION: 1.  No acute displaced skull fracture or  acute intracranial abnormality. 2.  Small left mastoid effusion, similar to the prior examination. 3.  Moderate cerebral and cerebellar atrophy with extensive chronic microvascular ischemic changes throughout the cerebral white matter redemonstrated, as above.   Original Report Authenticated By: Trudie Reed, M.D.   Ct Head Wo Contrast  01/24/2013   *RADIOLOGY REPORT*  Clinical Data:  Fall.  Trauma to back of head.  Hematoma.  CT HEAD WITHOUT CONTRAST CT CERVICAL SPINE WITHOUT CONTRAST  Technique:  Multidetector CT imaging of the head and cervical spine was performed following the standard protocol without intravenous contrast.  Multiplanar CT image reconstructions of the cervical spine were also generated.  Comparison:  CT head without contrast 12/03/2011.  CT head and cervical spine 08/10/2011.  CT HEAD  Findings: A small left parietal scalp hematoma is noted.  There is no underlying fracture.  Atrophy and diffuse white matter disease is not significantly changed.  No acute cortical focal infarct, hemorrhage, or mass lesion is present.  Remote lacunar infarcts of the  basal ganglia bilaterally are stable.  The ventricles are proportionate to the degree of atrophy.  No significant extra-axial fluid collection is present.  The paranasal sinuses and mastoid air cells are clear.  IMPRESSION:  1.  Left parietal scalp hematoma without an underlying fracture. 2.  Stable atrophy and diffuse white matter disease.  CT CERVICAL SPINE  Findings: The cervical spine is imaged from the skull base through T2.  Vertebral body heights and alignment are maintained. Degenerative changes are most evident at C3-4, C5-6, and C6-7, as before.  No acute fracture or traumatic subluxation is evident.  The lung apices are clear.  Heterogeneous enlargement of the thyroid with right-sided calcified nodules is similar to the prior study.  IMPRESSION:  1.  Stable moderate spondylosis of the cervical spine. 2.  No acute abnormality. 3.   Stable right-sided thyroid nodules.   Original Report Authenticated By: Marin Roberts, M.D.   Ct Cervical Spine Wo Contrast  01/24/2013   *RADIOLOGY REPORT*  Clinical Data:  Fall.  Trauma to back of head.  Hematoma.  CT HEAD WITHOUT CONTRAST CT CERVICAL SPINE WITHOUT CONTRAST  Technique:  Multidetector CT imaging of the head and cervical spine was performed following the standard protocol without intravenous contrast.  Multiplanar CT image reconstructions of the cervical spine were also generated.  Comparison:  CT head without contrast 12/03/2011.  CT head and cervical spine 08/10/2011.  CT HEAD  Findings: A small left parietal scalp hematoma is noted.  There is no underlying fracture.  Atrophy and diffuse white matter disease is not significantly changed.  No acute cortical focal infarct, hemorrhage, or mass lesion is present.  Remote lacunar infarcts of the basal ganglia bilaterally are stable.  The ventricles are proportionate to the degree of atrophy.  No significant extra-axial fluid collection is present.  The paranasal sinuses and mastoid air cells are clear.  IMPRESSION:  1.  Left parietal scalp hematoma without an underlying fracture. 2.  Stable atrophy and diffuse white matter disease.  CT CERVICAL SPINE  Findings: The cervical spine is imaged from the skull base through T2.  Vertebral body heights and alignment are maintained. Degenerative changes are most evident at C3-4, C5-6, and C6-7, as before.  No acute fracture or traumatic subluxation is evident.  The lung apices are clear.  Heterogeneous enlargement of the thyroid with right-sided calcified nodules is similar to the prior study.  IMPRESSION:  1.  Stable moderate spondylosis of the cervical spine. 2.  No acute abnormality. 3.  Stable right-sided thyroid nodules.   Original Report Authenticated By: Marin Roberts, M.D.   Ct Pelvis Wo Contrast  01/28/2013   *RADIOLOGY REPORT*  Clinical Data: History of fall complaining of bilateral  hip pain.  CT PELVIS WITHOUT CONTRAST  Technique:  Multidetector CT imaging of the pelvis was performed following the standard protocol without intravenous contrast.  Comparison: Right hip CT 12/26/2011.  Findings: There is some fragmentation of the right greater trochanter, however, this appears well corticated, most compatible with changes related to remote trauma.  No acute displaced fracture of either of the femoral necks is noted.  Likewise, there are no acute displaced fractures of the bony pelvis.  Old healed fractures of the right superior and inferior pubic rami are noted.  Mild degenerative changes at the symphysis pubis.  Additional incidental findings include a ventral hernia which is incompletely visualized, but appears to contain predominately omental fat.  Numerous colonic diverticula are noted, without surrounding inflammatory changes to suggest acute diverticulitis  at this time.  Status post hysterectomy.  Ovaries are not confidently identified may be surgically absent or atrophic.  IMPRESSION: 1.  Negative for acute fracture of the bony pelvis or either hip. 2.  Sequela of old right hip fracture with post-traumatic remodelling of the right greater trochanter. 3.  Old healed fractures of the right superior and inferior pubic rami. 4.  Colonic diverticulosis. 5.  Ventral hernia containing only omental fat incidentally noted.   Original Report Authenticated By: Trudie Reed, M.D.   Dg Knee Complete 4 Views Left  01/28/2013   *RADIOLOGY REPORT*  Clinical Data: History of fall with laceration on the leg.  LEFT KNEE - COMPLETE 4+ VIEW  Comparison: No priors.  Findings: Four views of the left knee demonstrate no acute displaced fracture, subluxation, dislocation, joint or soft tissue abnormality.  Mild degenerative changes of osteoarthritis are noted in a tricompartmental distribution.  IMPRESSION: No acute radiographic abnormality of the left knee.   Original Report Authenticated By: Trudie Reed, M.D.    MDM   CT scan of head ordered as patient reportedly with multiple falls, struck head. Rule out stroke intracranial hematoma. CT of pelvis ordered as patient has unreliable exam. Rule out occult hip fracture or occult pelvic fracture   Plan urine for culture. We will discontinue valproic acid as a truck acid contributes to ataxia in 8 percent of patients after discussion with the hospital pharmacist, even a therapeutic levels. There is no clear cut indication for valproic acid in this patient. Prescription Cipro Diagnosis #1 fall #2 urinary tract infection.     Doug Sou, MD 01/29/13 [phone]

## 2013-01-28 NOTE — ED Notes (Signed)
Pt s/p fall at [facility] ([address] Hit head. No LOC. Not witnessed. Pt seated in chair upon EMS arrival. Pain to posterior R side of head. No bruising or hematoma.

## 2013-01-28 NOTE — ED Notes (Signed)
Patient transported to CT 

## 2013-01-29 ENCOUNTER — Emergency Department (HOSPITAL_COMMUNITY)
Admission: EM | Admit: 2013-01-29 | Discharge: 2013-01-29 | Disposition: A | Discharge status: Home / Self Care | Payer: Medicare Other | Source: Home / Self Care | Attending: Emergency Medicine | Admitting: Emergency Medicine

## 2013-01-29 ENCOUNTER — Encounter (HOSPITAL_COMMUNITY): Payer: Self-pay | Admitting: *Deleted

## 2013-01-29 ENCOUNTER — Emergency Department (HOSPITAL_COMMUNITY): Payer: Medicare Other

## 2013-01-29 DIAGNOSIS — Y9289 Other specified places as the place of occurrence of the external cause: Secondary | ICD-10-CM | POA: Insufficient documentation

## 2013-01-29 DIAGNOSIS — F0390 Unspecified dementia without behavioral disturbance: Secondary | ICD-10-CM

## 2013-01-29 DIAGNOSIS — Z8719 Personal history of other diseases of the digestive system: Secondary | ICD-10-CM | POA: Insufficient documentation

## 2013-01-29 DIAGNOSIS — M109 Gout, unspecified: Secondary | ICD-10-CM | POA: Insufficient documentation

## 2013-01-29 DIAGNOSIS — Y9389 Activity, other specified: Secondary | ICD-10-CM | POA: Insufficient documentation

## 2013-01-29 DIAGNOSIS — S0990XA Unspecified injury of head, initial encounter: Secondary | ICD-10-CM | POA: Insufficient documentation

## 2013-01-29 DIAGNOSIS — S0181XA Laceration without foreign body of other part of head, initial encounter: Secondary | ICD-10-CM

## 2013-01-29 DIAGNOSIS — W1809XA Striking against other object with subsequent fall, initial encounter: Secondary | ICD-10-CM | POA: Insufficient documentation

## 2013-01-29 DIAGNOSIS — R5381 Other malaise: Secondary | ICD-10-CM | POA: Insufficient documentation

## 2013-01-29 DIAGNOSIS — N189 Chronic kidney disease, unspecified: Secondary | ICD-10-CM | POA: Insufficient documentation

## 2013-01-29 DIAGNOSIS — Z79899 Other long term (current) drug therapy: Secondary | ICD-10-CM | POA: Insufficient documentation

## 2013-01-29 DIAGNOSIS — Z880 Allergy status to penicillin: Secondary | ICD-10-CM | POA: Insufficient documentation

## 2013-01-29 DIAGNOSIS — S0180XA Unspecified open wound of other part of head, initial encounter: Secondary | ICD-10-CM | POA: Insufficient documentation

## 2013-01-29 DIAGNOSIS — S069X9A Unspecified intracranial injury with loss of consciousness of unspecified duration, initial encounter: Secondary | ICD-10-CM

## 2013-01-29 DIAGNOSIS — I129 Hypertensive chronic kidney disease with stage 1 through stage 4 chronic kidney disease, or unspecified chronic kidney disease: Secondary | ICD-10-CM | POA: Insufficient documentation

## 2013-01-29 DIAGNOSIS — B761 Necatoriasis: Secondary | ICD-10-CM | POA: Insufficient documentation

## 2013-01-29 DIAGNOSIS — R209 Unspecified disturbances of skin sensation: Secondary | ICD-10-CM | POA: Insufficient documentation

## 2013-01-29 DIAGNOSIS — Z792 Long term (current) use of antibiotics: Secondary | ICD-10-CM | POA: Insufficient documentation

## 2013-01-29 DIAGNOSIS — Z8669 Personal history of other diseases of the nervous system and sense organs: Secondary | ICD-10-CM | POA: Insufficient documentation

## 2013-01-29 MED ORDER — CIPROFLOXACIN HCL 250 MG PO TABS: 250.0000 mg | ORAL_TABLET | Freq: Two times a day (BID) | ORAL | Status: DC

## 2013-01-29 MED ORDER — CIPROFLOXACIN HCL 500 MG PO TABS
250.0000 mg | ORAL_TABLET | Freq: Once | ORAL | Status: AC
  Administered 2013-01-29: 250 mg via ORAL
  Filled 2013-01-29: qty 2
  Filled 2013-01-29: qty 1

## 2013-01-29 NOTE — ED Provider Notes (Signed)
History     CSN: [id number]  Arrival date & time 01/29/13  1429   First MD Initiated Contact with Patient 01/29/13 1529      Chief Complaint  Patient presents with  . Fall    (Consider location/radiation/quality/duration/timing/severity/associated sxs/prior treatment) HPI This 79 year old demented female was just seen last night here in the emergency department she had an unremarkable CT scan of the head pelvis she went to come to the nursing home and fell again hitting her head again causing a laceration to her face and is brought back to the emergency department reportedly by the request of her primary care Dr. who saw her today at the nursing home wants her to have another CT scan of her head and wound repair the patient's facial laceration. Patient denies any complaint she is no headache neck pain chest pain shortness breath abdominal pain or extremity pains. She is baseline generalized weakness without new focal change in speech vision swallowing or understanding or lateralizing or focal weakness or numbness or incoordination. She is supposed to use a walker at baseline but tends to sneak out of her bed and falls. Her tetanus shot is up-to-date given within the last 24 hours according to daughter. history obtained from prior records, daughter, and patient. Patient is pleasantly demented able to follow simple commands with no change in her baseline mental status today. Past Medical History  Diagnosis Date  . Hypertension   . Alzheimer's dementia   . Pneumonia   . Gout   . Tachycardia   . Chronic renal insufficiency   . Hernia     Past Surgical History  Procedure Laterality Date  . Cholecystectomy    . Abdominal hysterectomy    . Colon surgery    . Stomach surgery      No family history on file.  History  Substance Use Topics  . Smoking status: Never Smoker   . Smokeless tobacco: Never Used  . Alcohol Use: No    OB History   Grav Para Term Preterm Abortions TAB SAB  Ect Mult Living            3      Review of Systems 10 Systems reviewed and are negative for acute change except as noted in the HPI. Allergies  Cephalosporins; Macrolides and ketolides; and Penicillins  Home Medications   Current Outpatient Rx  Name  Route  Sig  Dispense  Refill  . acetaminophen (TYLENOL) 500 MG tablet   Oral   Take 500 mg by mouth every 4 (four) hours as needed for pain or fever.          Marland Kitchen alum & mag hydroxide-simeth (MAALOX/MYLANTA) 200-200-20 MG/5ML suspension   Oral   Take 30 mLs by mouth every 6 (six) hours as needed for indigestion.         Marland Kitchen amLODipine (NORVASC) 5 MG tablet   Oral   Take 5 mg by mouth every morning.          . Artificial Tear Ointment (ARTIFICIAL TEARS) ointment   Both Eyes   Place 1 application into both eyes 2 (two) times daily.         . calcium carbonate (OS-CAL) 600 MG TABS   Oral   Take 600 mg by mouth every morning.          . carboxymethylcellulose (REFRESH CELLUVISC) 1 % ophthalmic solution   Both Eyes   Place 1 drop into both eyes every 6 (six) hours.         Marland Kitchen  ciprofloxacin (CIPRO) 250 MG tablet   Oral   Take 250 mg by mouth every 12 (twelve) hours. Take 1 tablet at 8 am and 1 tablet at 8 pm         . divalproex (DEPAKOTE) 250 MG DR tablet   Oral   Take 250 mg by mouth 3 (three) times daily.         . Eye Patch MISC   Right Eye   Place 1 application into the right eye at bedtime.         Marland Kitchen FLUoxetine (PROZAC) 10 MG tablet   Oral   Take 10 mg by mouth every morning.          Marland Kitchen guaiFENesin (ROBITUSSIN) 100 MG/5ML liquid   Oral   Take 200 mg by mouth every 6 (six) hours as needed for cough. Do not exceed 4 doses         . Lactobacillus (ACIDOPHILUS) TABS   Oral   Take 1 tablet by mouth every morning.         . loperamide (IMODIUM A-D) 2 MG tablet   Oral   Take 2 mg by mouth 4 (four) times daily as needed for diarrhea or loose stools.         Marland Kitchen LORazepam (ATIVAN PO)    Topical   Apply 0.5-1 mg topically every 6 (six) hours as needed (agitation and unable to take oral form).         . LORazepam (ATIVAN) 0.5 MG tablet   Oral   Take 0.5 mg by mouth See admin instructions. Take 1 tablet every morning, and take one tablet every 6 hours as needed for agitation         . magnesium hydroxide (MILK OF MAGNESIA) 400 MG/5ML suspension   Oral   Take 30 mLs by mouth at bedtime as needed for constipation.          . mirtazapine (REMERON) 45 MG tablet   Oral   Take 45 mg by mouth at bedtime.         . nicotine (NICODERM CQ - DOSED IN MG/24 HOURS) 14 mg/24hr patch   Transdermal   Place 1 patch onto the skin every morning. Remove old patch before applying new patch         . Nutritional Supplement LIQD   Oral   Take 4 oz by mouth 2 (two) times daily. Medpass         . OLANZapine (ZYPREXA) 2.5 MG tablet   Oral   Take 2.5 mg by mouth every morning.          Marland Kitchen OLANZapine (ZYPREXA) 7.5 MG tablet   Oral   Take 7.5 mg by mouth at bedtime.         Marland Kitchen omeprazole (PRILOSEC) 20 MG capsule   Oral   Take 20 mg by mouth 2 (two) times daily.         . potassium chloride SA (K-DUR,KLOR-CON) 20 MEQ tablet   Oral   Take 20 mEq by mouth every morning.          . Probiotic Product (RISA-BID PROBIOTIC PO)   Oral   Take 1 tablet by mouth every morning.         . rivastigmine (EXELON) 4.6 mg/24hr   Transdermal   Place 1 patch onto the skin every morning.          . vitamin B-12 (CYANOCOBALAMIN) 1000 MCG tablet   Oral   Take  1,000 mcg by mouth every morning.            BP 122/65  Pulse 89  Temp(Src) 97.8 F (36.6 C) (Oral)  Resp 32  Ht 5\' 5"  (1.651 m)  Wt 190 lb (86.183 kg)  BMI 31.62 kg/m2  SpO2 97%  Physical Exam  Nursing note and vitals reviewed. Constitutional:  Awake, alert, nontoxic appearance with baseline speech for patient.  HENT:  Mouth/Throat: No oropharyngeal exudate.  2 cm nontender facial laceration just above  left eyebrow without active bleeding no foreign body noted  Eyes: EOM are normal. Pupils are equal, round, and reactive to light. Right eye exhibits no discharge. Left eye exhibits no discharge.  Neck: Neck supple.  Cardiovascular: Normal rate and regular rhythm.   No murmur heard. Pulmonary/Chest: Effort normal and breath sounds normal. No stridor. No respiratory distress. She has no wheezes. She has no rales. She exhibits no tenderness.  Abdominal: Soft. Bowel sounds are normal. She exhibits no mass. There is no tenderness. There is no rebound.  Musculoskeletal: She exhibits no tenderness.  Baseline ROM, moves extremities with no obvious new focal weakness or tenderness.  Lymphadenopathy:    She has no cervical adenopathy.  Neurological: She is alert.  Awake, alert, cooperative baseline pleasant confusion follow simple commands; motor strength 3/5 bilaterally; sensation normal to light touch bilaterally; peripheral visual fields full to confrontation; no facial asymmetry; tongue midline; major cranial nerves appear intact; no pronator drift, normal finger to nose bilaterally  Skin: No rash noted.  Psychiatric: She has a normal mood and affect.    ED Course  Procedures (including critical care time) Procedure Lac Repair: Timeout taken wound cleansed Safclens, Dermabond used, wound explored no foreign body or significant deep structure involvement noted, good wound closure with Dermabond patient tolerated well with no apparent immediate complications; 2 cm left forehead laceration repair  Patient / Family / Caregiver informed of clinical course, understand medical decision-making process, and agree with plan. Labs Reviewed - No data to display Ct Head Wo Contrast  01/29/2013   *RADIOLOGY REPORT*  Clinical Data: Fall with left frontal hematoma.  CT HEAD WITHOUT CONTRAST  Technique:  Contiguous axial images were obtained from the base of the skull through the vertex without contrast.   Comparison: 01/28/2013  Findings: Focal left-sided frontal scalp hematoma/soft tissue swelling is present. The brain demonstrates no evidence of hemorrhage, infarction, edema, mass effect, extra-axial fluid collection, hydrocephalus or mass lesion.  The skull is unremarkable.  Stable advanced small vessel disease is identified in the periventricular white matter as well as old lacunar infarcts of bilateral basal ganglia.  IMPRESSION: No acute findings.  Left frontal scalp hematoma.   Original Report Authenticated By: Irish Lack, M.D.   Ct Head Wo Contrast  01/28/2013   *RADIOLOGY REPORT*  Clinical Data: History of trauma from a fall.  CT HEAD WITHOUT CONTRAST  Technique:  Contiguous axial images were obtained from the base of the skull through the vertex without contrast.  Comparison: Head CT 01/24/2013.  Findings: Moderate cerebral and cerebellar atrophy.  Extensive patchy and confluent areas of decreased attenuation throughout the deep and periventricular white matter of the cerebral hemispheres bilaterally, compatible with advanced chronic microvascular ischemic disease. No acute displaced skull fractures are identified.  No acute intracranial abnormality.  Specifically, no evidence of acute post-traumatic intracranial hemorrhage, no definite regions of acute/subacute cerebral ischemia, no focal mass, mass effect, hydrocephalus or abnormal intra or extra-axial fluid collections.  The visualized paranasal sinuses and  mastoids are well pneumatized, with exception of a small left mastoid effusion (unchanged).  IMPRESSION: 1.  No acute displaced skull fracture or acute intracranial abnormality. 2.  Small left mastoid effusion, similar to the prior examination. 3.  Moderate cerebral and cerebellar atrophy with extensive chronic microvascular ischemic changes throughout the cerebral white matter redemonstrated, as above.   Original Report Authenticated By: Trudie Reed, M.D.   Ct Pelvis Wo  Contrast  01/28/2013   *RADIOLOGY REPORT*  Clinical Data: History of fall complaining of bilateral hip pain.  CT PELVIS WITHOUT CONTRAST  Technique:  Multidetector CT imaging of the pelvis was performed following the standard protocol without intravenous contrast.  Comparison: Right hip CT 12/26/2011.  Findings: There is some fragmentation of the right greater trochanter, however, this appears well corticated, most compatible with changes related to remote trauma.  No acute displaced fracture of either of the femoral necks is noted.  Likewise, there are no acute displaced fractures of the bony pelvis.  Old healed fractures of the right superior and inferior pubic rami are noted.  Mild degenerative changes at the symphysis pubis.  Additional incidental findings include a ventral hernia which is incompletely visualized, but appears to contain predominately omental fat.  Numerous colonic diverticula are noted, without surrounding inflammatory changes to suggest acute diverticulitis at this time.  Status post hysterectomy.  Ovaries are not confidently identified may be surgically absent or atrophic.  IMPRESSION: 1.  Negative for acute fracture of the bony pelvis or either hip. 2.  Sequela of old right hip fracture with post-traumatic remodelling of the right greater trochanter. 3.  Old healed fractures of the right superior and inferior pubic rami. 4.  Colonic diverticulosis. 5.  Ventral hernia containing only omental fat incidentally noted.   Original Report Authenticated By: Trudie Reed, M.D.   Dg Knee Complete 4 Views Left  01/28/2013   *RADIOLOGY REPORT*  Clinical Data: History of fall with laceration on the leg.  LEFT KNEE - COMPLETE 4+ VIEW  Comparison: No priors.  Findings: Four views of the left knee demonstrate no acute displaced fracture, subluxation, dislocation, joint or soft tissue abnormality.  Mild degenerative changes of osteoarthritis are noted in a tricompartmental distribution.  IMPRESSION: No  acute radiographic abnormality of the left knee.   Original Report Authenticated By: Trudie Reed, M.D.     1. Minor head injury with loss of consciousness, initial encounter   2. Dementia, without behavioral disturbance   3. Forehead laceration, initial encounter       MDM  I doubt any other EMC precluding discharge at this time including, but not necessarily limited to the following:[facility].       Hurman Horn, MD 01/30/13 [phone]

## 2013-01-29 NOTE — ED Notes (Signed)
Per EMS, pt from [facility], staff and daughter reports fall at 0400.  Reports pt had fallen prior to this fall and was seen in the ED. Staff reports calling pt's daughter after the 0400 fall and informed the daughter that pt's injuries did not seem to be severe that she needed to come  Now pt is transported to the ED to have stitches placed per daughter and PMD's request.  Pt has hx of alzheimer's.  Lac above the L eye noted per EMS.

## 2013-01-29 NOTE — ED Notes (Signed)
Dermabond at bedside.  

## 2013-01-29 NOTE — ED Notes (Signed)
Daughter POA took pt back to memory care home.  Attempted call [phone] no answer.  rx x 1 given to daughter

## 2013-01-29 NOTE — Progress Notes (Signed)
CSW confirmed patient from [facility].   Marland KitchenCatha Gosselin, [place]  [phone] 01/29/2013 1709

## 2013-01-29 NOTE — ED Provider Notes (Signed)
History     CSN: [id number]  Arrival date & time 01/28/13  2134   First MD Initiated Contact with Patient 01/28/13 2215      Chief Complaint  Patient presents with  . Fall    (Consider location/radiation/quality/duration/timing/severity/associated sxs/prior treatment) HPI  Past Medical History  Diagnosis Date  . Hypertension   . Alzheimer's dementia   . Pneumonia   . Gout   . Tachycardia   . Chronic renal insufficiency   . Hernia     Past Surgical History  Procedure Laterality Date  . Cholecystectomy    . Abdominal hysterectomy    . Colon surgery    . Stomach surgery      No family history on file.  History  Substance Use Topics  . Smoking status: Never Smoker   . Smokeless tobacco: Never Used  . Alcohol Use: No    OB History   Grav Para Term Preterm Abortions TAB SAB Ect Mult Living            3      Review of Systems  Allergies  Cephalosporins; Macrolides and ketolides; and Penicillins  Home Medications   Current Outpatient Rx  Name  Route  Sig  Dispense  Refill  . acetaminophen (TYLENOL) 500 MG tablet   Oral   Take 500 mg by mouth every 4 (four) hours as needed for pain.         Marland Kitchen alum & mag hydroxide-simeth (MAALOX/MYLANTA) 200-200-20 MG/5ML suspension   Oral   Take 30 mLs by mouth every 6 (six) hours as needed for indigestion.         Marland Kitchen amLODipine (NORVASC) 5 MG tablet   Oral   Take 5 mg by mouth daily.         . Artificial Tear Ointment (ARTIFICIAL TEARS) ointment   Both Eyes   Place 1 application into both eyes 2 (two) times daily.         . calcium carbonate (OS-CAL) 600 MG TABS   Oral   Take 600 mg by mouth daily.          . divalproex (DEPAKOTE) 250 MG DR tablet   Oral   Take 250 mg by mouth 3 (three) times daily.         Marland Kitchen FLUoxetine (PROZAC) 10 MG tablet   Oral   Take 10 mg by mouth daily.         Marland Kitchen guaiFENesin (ROBITUSSIN) 100 MG/5ML liquid   Oral   Take 200 mg by mouth 3 (three) times daily as needed  for cough.         . loperamide (IMODIUM A-D) 2 MG tablet   Oral   Take 2 mg by mouth 4 (four) times daily as needed for diarrhea or loose stools.         Marland Kitchen LORazepam (ATIVAN PO)   Topical   Apply 0.5-1 mg topically every 6 (six) hours as needed (agitation and unable to take oral form).         . LORazepam (ATIVAN) 0.5 MG tablet   Oral   Take 0.5 mg by mouth every morning.          . magnesium hydroxide (MILK OF MAGNESIA) 400 MG/5ML suspension   Oral   Take 30 mLs by mouth daily as needed for constipation.         . mirtazapine (REMERON) 45 MG tablet   Oral   Take 45 mg by mouth at bedtime.         Marland Kitchen  nicotine (NICODERM CQ - DOSED IN MG/24 HOURS) 14 mg/24hr patch   Transdermal   Place 1 patch onto the skin daily.         Marland Kitchen OLANZapine (ZYPREXA) 2.5 MG tablet   Oral   Take 2.5 mg by mouth every morning.          Marland Kitchen OLANZapine (ZYPREXA) 7.5 MG tablet   Oral   Take 7.5 mg by mouth at bedtime.         Marland Kitchen omeprazole (PRILOSEC) 20 MG capsule   Oral   Take 20 mg by mouth 2 (two) times daily.         . polyvinyl alcohol-povidone (HYPOTEARS) 1.4-0.6 % ophthalmic solution   Both Eyes   Place 1 drop into both eyes every 6 (six) hours. Wait 3-5 minutes between drops         . potassium chloride SA (K-DUR,KLOR-CON) 20 MEQ tablet   Oral   Take 20 mEq by mouth daily.         . Probiotic Product (RISA-BID PROBIOTIC PO)   Oral   Take 1 tablet by mouth daily.         . rivastigmine (EXELON) 4.6 mg/24hr   Transdermal   Place 1 patch onto the skin daily.         . vitamin B-12 (CYANOCOBALAMIN) 1000 MCG tablet   Oral   Take 1,000 mcg by mouth daily.           BP 108/52  Pulse 94  Temp(Src) 98.7 F (37.1 C) (Rectal)  Resp 21  SpO2 91%  Physical Exam  ED Course  Procedures (including critical care time)  Labs Reviewed  URINALYSIS, ROUTINE W REFLEX MICROSCOPIC - Abnormal; Notable for the following:    APPearance CLOUDY (*)    Bilirubin  Urine SMALL (*)    Urobilinogen, UA 2.0 (*)    Nitrite POSITIVE (*)    Leukocytes, UA SMALL (*)    All other components within normal limits  URINE MICROSCOPIC-ADD ON - Abnormal; Notable for the following:    Bacteria, UA MANY (*)    All other components within normal limits  POCT I-STAT, CHEM 8 - Abnormal; Notable for the following:    Sodium 147 (*)    Hemoglobin 11.6 (*)    HCT 34.0 (*)    All other components within normal limits  URINE CULTURE   Ct Head Wo Contrast  01/28/2013   *RADIOLOGY REPORT*  Clinical Data: History of trauma from a fall.  CT HEAD WITHOUT CONTRAST  Technique:  Contiguous axial images were obtained from the base of the skull through the vertex without contrast.  Comparison: Head CT 01/24/2013.  Findings: Moderate cerebral and cerebellar atrophy.  Extensive patchy and confluent areas of decreased attenuation throughout the deep and periventricular white matter of the cerebral hemispheres bilaterally, compatible with advanced chronic microvascular ischemic disease. No acute displaced skull fractures are identified.  No acute intracranial abnormality.  Specifically, no evidence of acute post-traumatic intracranial hemorrhage, no definite regions of acute/subacute cerebral ischemia, no focal mass, mass effect, hydrocephalus or abnormal intra or extra-axial fluid collections.  The visualized paranasal sinuses and mastoids are well pneumatized, with exception of a small left mastoid effusion (unchanged).  IMPRESSION: 1.  No acute displaced skull fracture or acute intracranial abnormality. 2.  Small left mastoid effusion, similar to the prior examination. 3.  Moderate cerebral and cerebellar atrophy with extensive chronic microvascular ischemic changes throughout the cerebral white matter redemonstrated, as above.  Original Report Authenticated By: Trudie Reed, M.D.   Ct Pelvis Wo Contrast  01/28/2013   *RADIOLOGY REPORT*  Clinical Data: History of fall complaining of  bilateral hip pain.  CT PELVIS WITHOUT CONTRAST  Technique:  Multidetector CT imaging of the pelvis was performed following the standard protocol without intravenous contrast.  Comparison: Right hip CT 12/26/2011.  Findings: There is some fragmentation of the right greater trochanter, however, this appears well corticated, most compatible with changes related to remote trauma.  No acute displaced fracture of either of the femoral necks is noted.  Likewise, there are no acute displaced fractures of the bony pelvis.  Old healed fractures of the right superior and inferior pubic rami are noted.  Mild degenerative changes at the symphysis pubis.  Additional incidental findings include a ventral hernia which is incompletely visualized, but appears to contain predominately omental fat.  Numerous colonic diverticula are noted, without surrounding inflammatory changes to suggest acute diverticulitis at this time.  Status post hysterectomy.  Ovaries are not confidently identified may be surgically absent or atrophic.  IMPRESSION: 1.  Negative for acute fracture of the bony pelvis or either hip. 2.  Sequela of old right hip fracture with post-traumatic remodelling of the right greater trochanter. 3.  Old healed fractures of the right superior and inferior pubic rami. 4.  Colonic diverticulosis. 5.  Ventral hernia containing only omental fat incidentally noted.   Original Report Authenticated By: Trudie Reed, M.D.   Dg Knee Complete 4 Views Left  01/28/2013   *RADIOLOGY REPORT*  Clinical Data: History of fall with laceration on the leg.  LEFT KNEE - COMPLETE 4+ VIEW  Comparison: No priors.  Findings: Four views of the left knee demonstrate no acute displaced fracture, subluxation, dislocation, joint or soft tissue abnormality.  Mild degenerative changes of osteoarthritis are noted in a tricompartmental distribution.  IMPRESSION: No acute radiographic abnormality of the left knee.   Original Report Authenticated By:  Trudie Reed, M.D.     No diagnosis found.  CT reports and lab data reviewed by me. X-ray of knee and report viewede by me MDM  Plan urine sent for culture.   WE will discontinue valproic acid as valproic acid can cause ataxia  8% patients after discussion with the hospital pharmacist. There is no clear cut indication for patient to have valproic acid. Prescription Cipro 250 twice a day times one week Diagnosis #1 fall #2 urinary tract infection           Doug Sou, MD 01/29/13 7829

## 2013-01-31 LAB — URINE CULTURE: Colony Count: >=100000

## 2013-02-01 ENCOUNTER — Telehealth (HOSPITAL_COMMUNITY): Payer: Self-pay | Admitting: Emergency Medicine

## 2013-02-01 NOTE — Progress Notes (Signed)
ED Antimicrobial Stewardship Positive Culture Follow Up   Tanya Santana is an 79 y.o. female who presented to [facility] on 01/28/2013 with a chief complaint of fall  Chief Complaint  Patient presents with  . Fall    Recent Results (from the past 720 hour(s))  URINE CULTURE     Status: None   Collection Time    01/28/13 10:26 PM      Result Value Range Status   Specimen Description URINE, CATHETERIZED   Final   Special Requests NONE   Final   Culture  Setup Time 01/29/2013 12:26   Final   Colony Count >=100,000 COLONIES/ML   Final   Culture ESCHERICHIA COLI   Final   Report Status 01/31/2013 FINAL   Final   Organism ID, Bacteria ESCHERICHIA COLI   Final    [x]  Treated with Cipro, organism resistant to prescribed antimicrobial []  Patient discharged originally without antimicrobial agent and treatment is now indicated  New antibiotic prescription: Bactrim DS 1 tab twice daily x 7 days  ED Provider: Rhea Bleacher, PA-C  [name] 02/01/2013, 9:33 AM Infectious Diseases Pharmacist Phone# [phone]

## 2013-02-01 NOTE — ED Notes (Signed)
Post ED Visit - Positive Culture Follow-up: Successful Patient Follow-Up  Culture assessed and recommendations reviewed by: []  Wes Dulaney, Pharm.D., BCPS []  Celedonio Miyamoto, Pharm.D., BCPS [x]  Georgina Pillion, Pharm.D., BCPS []  [place], [place].D., BCPS, AAHIVP []  Estella Husk, Pharm.D., BCPS, AAHIVP  Positive urine culture  []  Patient discharged without antimicrobial prescription and treatment is now indicated [x]  Organism is resistant to prescribed ED discharge antimicrobial []  Patient with positive blood cultures  Changes discussed with ED provider: Rhea Bleacher PA-C New antibiotic prescription: Bactrim DS 1 tab bid x 7 days. Stop Cipro.    Kylie A Holland 02/01/2013, 10:36 AM

## 2013-02-22 ENCOUNTER — Encounter (HOSPITAL_COMMUNITY): Payer: Self-pay | Admitting: *Deleted

## 2013-02-22 ENCOUNTER — Emergency Department (HOSPITAL_COMMUNITY): Payer: Medicare Other

## 2013-02-22 ENCOUNTER — Emergency Department (HOSPITAL_COMMUNITY)
Admission: EM | Admit: 2013-02-22 | Discharge: 2013-02-22 | Disposition: A | Discharge status: Home / Self Care | Payer: Medicare Other | Attending: Emergency Medicine | Admitting: Emergency Medicine

## 2013-02-22 DIAGNOSIS — N189 Chronic kidney disease, unspecified: Secondary | ICD-10-CM | POA: Insufficient documentation

## 2013-02-22 DIAGNOSIS — Z8639 Personal history of other endocrine, nutritional and metabolic disease: Secondary | ICD-10-CM | POA: Insufficient documentation

## 2013-02-22 DIAGNOSIS — Z79899 Other long term (current) drug therapy: Secondary | ICD-10-CM | POA: Insufficient documentation

## 2013-02-22 DIAGNOSIS — W19XXXA Unspecified fall, initial encounter: Secondary | ICD-10-CM

## 2013-02-22 DIAGNOSIS — S46909A Unspecified injury of unspecified muscle, fascia and tendon at shoulder and upper arm level, unspecified arm, initial encounter: Secondary | ICD-10-CM | POA: Insufficient documentation

## 2013-02-22 DIAGNOSIS — Z880 Allergy status to penicillin: Secondary | ICD-10-CM | POA: Insufficient documentation

## 2013-02-22 DIAGNOSIS — I129 Hypertensive chronic kidney disease with stage 1 through stage 4 chronic kidney disease, or unspecified chronic kidney disease: Secondary | ICD-10-CM | POA: Insufficient documentation

## 2013-02-22 DIAGNOSIS — F0280 Dementia in other diseases classified elsewhere without behavioral disturbance: Secondary | ICD-10-CM | POA: Insufficient documentation

## 2013-02-22 DIAGNOSIS — Y939 Activity, unspecified: Secondary | ICD-10-CM | POA: Insufficient documentation

## 2013-02-22 DIAGNOSIS — S4980XA Other specified injuries of shoulder and upper arm, unspecified arm, initial encounter: Secondary | ICD-10-CM | POA: Insufficient documentation

## 2013-02-22 DIAGNOSIS — Z862 Personal history of diseases of the blood and blood-forming organs and certain disorders involving the immune mechanism: Secondary | ICD-10-CM | POA: Insufficient documentation

## 2013-02-22 DIAGNOSIS — Z8719 Personal history of other diseases of the digestive system: Secondary | ICD-10-CM | POA: Insufficient documentation

## 2013-02-22 DIAGNOSIS — G309 Alzheimer's disease, unspecified: Secondary | ICD-10-CM | POA: Insufficient documentation

## 2013-02-22 DIAGNOSIS — Y9289 Other specified places as the place of occurrence of the external cause: Secondary | ICD-10-CM | POA: Insufficient documentation

## 2013-02-22 DIAGNOSIS — Z8701 Personal history of pneumonia (recurrent): Secondary | ICD-10-CM | POA: Insufficient documentation

## 2013-02-22 DIAGNOSIS — N390 Urinary tract infection, site not specified: Secondary | ICD-10-CM | POA: Insufficient documentation

## 2013-02-22 DIAGNOSIS — W1809XA Striking against other object with subsequent fall, initial encounter: Secondary | ICD-10-CM | POA: Insufficient documentation

## 2013-02-22 LAB — POCT I-STAT, CHEM 8
BUN: 21 mg/dL (ref 6–23)
Calcium, Ion: 1.16 mmol/L (ref 1.13–1.30)
Creatinine, Ser: 1.2 mg/dL — ABNORMAL HIGH (ref 0.50–1.10)
TCO2: 18 mmol/L (ref 0–100)

## 2013-02-22 LAB — URINALYSIS, ROUTINE W REFLEX MICROSCOPIC
Bilirubin Urine: NEGATIVE
Glucose, UA: NEGATIVE mg/dL
Hgb urine dipstick: NEGATIVE
Ketones, ur: NEGATIVE mg/dL
Protein, ur: NEGATIVE mg/dL

## 2013-02-22 MED ORDER — CIPROFLOXACIN HCL 250 MG PO TABS: 250.0000 mg | ORAL_TABLET | Freq: Two times a day (BID) | ORAL | Status: DC

## 2013-02-22 MED ORDER — CIPROFLOXACIN HCL 500 MG PO TABS
250.0000 mg | ORAL_TABLET | Freq: Once | ORAL | Status: AC
  Administered 2013-02-22: 250 mg via ORAL
  Filled 2013-02-22: qty 1

## 2013-02-22 MED ORDER — CLOTRIMAZOLE 1 % EX CREA: TOPICAL_CREAM | CUTANEOUS | Status: DC

## 2013-02-22 NOTE — ED Notes (Signed)
Pt with reddened area to above perineal area to abd fold; area cleanse and barrier cream applied; pt also with reddened area to buttocks with one small area to rt buttock approx size of dime that has some bloody drainage.

## 2013-02-22 NOTE — ED Notes (Signed)
RUE:[id number]<JW> Expected date:<BR> Expected time:<BR> Means of arrival:<BR> Comments:<BR> EMS/79 yo female

## 2013-02-22 NOTE — ED Provider Notes (Signed)
History    CSN: [id number] Arrival date & time 02/22/13  2013  First MD Initiated Contact with Patient 02/22/13 2045     Chief Complaint  Patient presents with  . Fall   (Consider location/radiation/quality/duration/timing/severity/associated sxs/prior Treatment) HPI Comments: Patient is an 79 year old female with history of Alzheimer's dementia who presents after a fall at [facility]. Per the daughter patient was transferring to a chair from a wheelchair when she misjudged the seat behind her and fell backwards hitting the back of her head on a wall. Patient with "dime sized knot" on parietal scalp, just right of midline. Daughter denies any loss of consciousness. Patient also complaining of pain in her right shoulder and scapula. Pain is intermittent in patient appears to be in no visible or audible discomfort at this time. Daughter states that patient has been falling more frequently and on her last visit was found to have urinary tract infection. Her concern is that patient with a urinary tract infection today causing her fall. Daughter denies complaints of CP, SOB, N/V, abdominal pain, and numbness/tingling. Patient ambulates with a walker at baseline. She is not on any blood thinners.  Level 5 caveat applies 2/2 alzheimer's dementia.  Patient is a 79 y.o. female presenting with fall. The history is provided by the patient and a relative. History limited by: alzheimer's dementia hx. No language interpreter was used.  Fall Associated symptoms include arthralgias.   Past Medical History  Diagnosis Date  . Hypertension   . Alzheimer's dementia   . Pneumonia   . Gout   . Tachycardia   . Chronic renal insufficiency   . Hernia    Past Surgical History  Procedure Laterality Date  . Cholecystectomy    . Abdominal hysterectomy    . Colon surgery    . Stomach surgery     No family history on file. History  Substance Use Topics  . Smoking status: Never Smoker   . Smokeless  tobacco: Never Used  . Alcohol Use: No   OB History   Grav Para Term Preterm Abortions TAB SAB Ect Mult Living            3     Review of Systems  Unable to perform ROS: Dementia  Musculoskeletal: Positive for arthralgias.  Skin: Positive for wound.    Allergies  Cephalosporins; Macrolides and ketolides; and Penicillins  Home Medications   Current Outpatient Rx  Name  Route  Sig  Dispense  Refill  . acetaminophen (TYLENOL) 500 MG tablet   Oral   Take 500 mg by mouth every 4 (four) hours as needed for pain or fever.          Marland Kitchen alum & mag hydroxide-simeth (MAALOX/MYLANTA) 200-200-20 MG/5ML suspension   Oral   Take 30 mLs by mouth every 6 (six) hours as needed for indigestion.         Marland Kitchen amLODipine (NORVASC) 5 MG tablet   Oral   Take 5 mg by mouth every morning.          . Artificial Tear Ointment (ARTIFICIAL TEARS) ointment   Both Eyes   Place 1 application into both eyes 3 (three) times daily.          . calcium carbonate (OS-CAL) 600 MG TABS   Oral   Take 600 mg by mouth every morning.          . Eye Patch MISC   Right Eye   Place 1 application into the right  eye at bedtime.         Marland Kitchen FLUoxetine (PROZAC) 10 MG tablet   Oral   Take 10 mg by mouth every morning.          Marland Kitchen guaiFENesin (ROBITUSSIN) 100 MG/5ML liquid   Oral   Take 200 mg by mouth every 6 (six) hours as needed for cough. Do not exceed 4 doses         . Lactobacillus (ACIDOPHILUS) TABS   Oral   Take 1 tablet by mouth every morning.         . loperamide (IMODIUM A-D) 2 MG tablet   Oral   Take 2 mg by mouth 4 (four) times daily as needed for diarrhea or loose stools.         Marland Kitchen LORazepam (ATIVAN PO)   Topical   Apply 0.5-1 mg topically every 6 (six) hours as needed (agitation and unable to take oral form).         . LORazepam (ATIVAN) 0.5 MG tablet   Oral   Take 0.5 mg by mouth See admin instructions. Take 1 tablet every morning, and take one tablet every 6 hours as  needed for agitation         . magnesium hydroxide (MILK OF MAGNESIA) 400 MG/5ML suspension   Oral   Take 30 mLs by mouth at bedtime as needed for constipation.          . mirtazapine (REMERON) 30 MG tablet   Oral   Take 30 mg by mouth at bedtime.         . nicotine (NICODERM CQ - DOSED IN MG/24 HOURS) 14 mg/24hr patch   Transdermal   Place 1 patch onto the skin every morning. Remove old patch before applying new patch         . Nutritional Supplement LIQD   Oral   Take 4 oz by mouth 2 (two) times daily. Medpass         . OLANZapine (ZYPREXA) 7.5 MG tablet   Oral   Take 7.5 mg by mouth at bedtime.         Marland Kitchen omeprazole (PRILOSEC) 20 MG capsule   Oral   Take 20 mg by mouth 2 (two) times daily.         . potassium chloride SA (K-DUR,KLOR-CON) 20 MEQ tablet   Oral   Take 20 mEq by mouth every morning.          . rivastigmine (EXELON) 4.6 mg/24hr   Transdermal   Place 1 patch onto the skin every morning.          . vitamin B-12 (CYANOCOBALAMIN) 1000 MCG tablet   Oral   Take 1,000 mcg by mouth every morning.          . ciprofloxacin (CIPRO) 250 MG tablet   Oral   Take 1 tablet (250 mg total) by mouth every 12 (twelve) hours.   14 tablet   0    BP 134/67  Pulse 99  Temp(Src) 97.8 F (36.6 C) (Oral)  Resp 20  SpO2 93%  Physical Exam  Nursing note and vitals reviewed. Constitutional: She appears well-developed and well-nourished. No distress.  HENT:  Head: Normocephalic.  Right Ear: External ear normal.  Left Ear: External ear normal.  Mouth/Throat: Oropharynx is clear and moist. No oropharyngeal exudate.  Eyes: Conjunctivae are normal. Right eye exhibits no discharge. Left eye exhibits no discharge. No scleral icterus.  Anisocoria 2/2 eye dilation at ophthalmologist today; pupils  reactive to light and round. Hx of R retinal detachment.  Neck: Normal range of motion. Neck supple.  Cardiovascular: Normal rate, regular rhythm and normal heart  sounds.   Pulmonary/Chest: Effort normal and breath sounds normal. No respiratory distress. She has no wheezes. She has no rales.  Abdominal: Soft. She exhibits no distension. There is no tenderness. There is no rebound and no guarding.  Neurological: She is alert. GCS eye subscore is 4. GCS verbal subscore is 5. GCS motor subscore is 6.  Patient A&O. Daughter endorses patient mentating at baseline. Patient answers questions appropriately and follows simple commands. Equal grip strength without sensory deficits. No focal neurologic deficits appreciated.  Skin: Skin is warm and dry. No rash noted. She is not diaphoretic. No erythema. No pallor.  No contusions, ecchymosis, abrasions, or other evidence of trauma  Psychiatric: She has a normal mood and affect. Her behavior is normal.   ED Course  Procedures (including critical care time) Labs Reviewed  URINALYSIS, ROUTINE W REFLEX MICROSCOPIC - Abnormal; Notable for the following:    APPearance HAZY (*)    Nitrite POSITIVE (*)    Leukocytes, UA MODERATE (*)    All other components within normal limits  URINE MICROSCOPIC-ADD ON - Abnormal; Notable for the following:    Bacteria, UA MANY (*)    All other components within normal limits  POCT I-STAT, CHEM 8 - Abnormal; Notable for the following:    Creatinine, Ser 1.20 (*)    Glucose, Bld 109 (*)    Hemoglobin 11.9 (*)    HCT 35.0 (*)    All other components within normal limits  URINE CULTURE   Dg Scapula Right  02/22/2013   *RADIOLOGY REPORT*  Clinical Data: Fall with right scapula pain.  RIGHT SCAPULA - 2+ VIEWS  Comparison: None  Findings: No evidence of acute fracture, subluxation or dislocation identified.  No radio-opaque foreign bodies are present.  No focal bony lesions are noted.  The joint spaces are unremarkable.  IMPRESSION: No evidence of acute bony abnormality.   Original Report Authenticated By: Harmon Pier, M.D.   Dg Shoulder Right  02/22/2013   *RADIOLOGY REPORT*  Clinical  Data: Right shoulder injury and pain.  RIGHT SHOULDER - 2+ VIEW  Comparison: None  Findings: There is no evidence of fracture, subluxation or dislocation. Mild degenerative changes of the [facility] and glenohumeral joints noted. No focal bony lesions are present. The visualized right bony thorax is unremarkable.  IMPRESSION: No evidence of acute bony abnormality.   Original Report Authenticated By: Harmon Pier, M.D.   Ct Head Wo Contrast  02/22/2013   *RADIOLOGY REPORT*  Clinical Data: Fall with head injury and headache.  CT HEAD WITHOUT CONTRAST  Technique:  Contiguous axial images were obtained from the base of the skull through the vertex without contrast.  Comparison: 01/29/2013 and prior head CTs  Findings: Atrophy and chronic small vessel white matter ischemic changes are again noted.  No acute intracranial abnormalities are identified, including mass lesion or mass effect, hydrocephalus, extra-axial fluid collection, midline shift, hemorrhage, or acute infarction.  The visualized bony calvarium is unremarkable.  IMPRESSION: No evidence of acute intracranial abnormality.   Original Report Authenticated By: Harmon Pier, M.D.   1. UTI (urinary tract infection)   2. Fall at nursing home, initial encounter    MDM  Patient with mechanical fall at nursing home c/o of R shoulder/scapula pain. Patient mentating at baseline on arrival and responds to questions appropriately. No focal neurologic deficits  appreciated. No visible signs of trauma on physical exam and patient moves R shoulder without difficulty; full ROM. CT head without evidence of acute hemorrhage, hydrocephalus, mass/lesion, or infarct. DG scapula and shoulder without evidence of acute fracture, dislocation, or bony abnormality. UA significant for infection. Kidney function preserved on chem 8. Patient given Ciprofloxacin in ED. Afebrile and hemodynamically stable; appropriate for d/c back to nursing home with Rx for Cipro for UTI. Indications for ED  return provided. Patient and daughter verbalize comfort and understanding with this d/c plan. Work up, assessment, and management discussed with Dr. Jeraldine Loots who agrees with stability for d/c.    Antony Madura, PA-C 02/27/13 1534

## 2013-02-22 NOTE — ED Notes (Signed)
Pt to ER via EMS from [facility] sp fall; pt was in a wheelchair and attempted to transfer self to a chair; pt missed the chair and struck back of head on a half-wall and also c/o rt scapula pain; no LOC; small dime sized knot to back of head; no bruising or redness noted to scapula; pt alert and oriented at the present per her normal; pt able to move all extremities without difficulty; normal sensation and rotation of rt shoulder. Daughter states that pt has had vision problems for the last few weeks; pt saw Opth. Today and had eyes dilated; left eye 6mm and reactive; rt eye 4mm and reactive.

## 2013-02-24 LAB — URINE CULTURE

## 2013-02-25 ENCOUNTER — Telehealth (HOSPITAL_COMMUNITY): Payer: Self-pay | Admitting: Emergency Medicine

## 2013-02-25 NOTE — Progress Notes (Signed)
  ED Antimicrobial Stewardship Positive Culture Follow Up   Tanya Santana is an 79 y.o. female who presented to [facility] on 02/22/2013 with a chief complaint of fall.  Chief Complaint  Patient presents with  . Fall    Recent Results (from the past 720 hour(s))  URINE CULTURE     Status: None   Collection Time    01/28/13 10:26 PM      Result Value Range Status   Specimen Description URINE, CATHETERIZED   Final   Special Requests NONE   Final   Culture  Setup Time 01/29/2013 12:26   Final   Colony Count >=100,000 COLONIES/ML   Final   Culture ESCHERICHIA COLI   Final   Report Status 01/31/2013 FINAL   Final   Organism ID, Bacteria ESCHERICHIA COLI   Final  URINE CULTURE     Status: None   Collection Time    02/22/13 10:48 PM      Result Value Range Status   Specimen Description URINE, CATHETERIZED   Final   Special Requests NONE   Final   Culture  Setup Time 02/23/2013 05:05   Final   Colony Count >=100,000 COLONIES/ML   Final   Culture ESCHERICHIA COLI   Final   Report Status 02/24/2013 FINAL   Final   Organism ID, Bacteria ESCHERICHIA COLI   Final    [x]  Treated with Ciprofloxacin, organism resistant to prescribed antimicrobial []  Patient discharged originally without antimicrobial agent and treatment is now indicated  New antibiotic prescription: Bactrim DS 1 tablet PO BID x 7 days  ED Provider: Francee Piccolo, PA-C   [name] 02/25/2013, 4:58 PM Infectious Diseases Pharmacist Phone# [phone]

## 2013-02-25 NOTE — ED Notes (Signed)
Post ED Visit - Positive Culture Follow-up: Successful Patient Follow-Up  Culture assessed and recommendations reviewed by: [x]  Wes Dulaney, Pharm.D., BCPS []  Celedonio Miyamoto, Pharm.D., BCPS []  Georgina Pillion, [address].D., BCPS []  [place], [place].D., BCPS, AAHIVP []  Estella Husk, Pharm.D., BCPS, AAHIVP  Positive urine culture  []  Patient discharged without antimicrobial prescription and treatment is now indicated [x]  Organism is resistant to prescribed ED discharge antimicrobial []  Patient with positive blood cultures  Changes discussed with ED provider: Francee Piccolo PA-C New antibiotic prescription: Stop Cipro. Start Bactrim DS 1 tab BID x 7 days.    Kylie A Holland 02/25/2013, 4:54 PM

## 2013-02-28 ENCOUNTER — Telehealth (HOSPITAL_COMMUNITY): Payer: Self-pay | Admitting: Emergency Medicine

## 2013-02-28 NOTE — Telephone Encounter (Signed)
Patient resident at [facility]. Notified Shareena, med tech of + urine culture and need for change in antibiotic. Result and med order faxed to [facility] [phone]

## 2013-02-28 NOTE — ED Provider Notes (Signed)
  Medical screening examination/treatment/procedure(s) were performed by non-physician practitioner and as supervising physician I was immediately available for consultation/collaboration.    Gerhard Munch, MD 02/28/13 [phone]

## 2013-06-27 ENCOUNTER — Emergency Department (HOSPITAL_COMMUNITY)
Admission: EM | Admit: 2013-06-27 | Discharge: 2013-06-28 | Disposition: A | Discharge status: Home / Self Care | Payer: Medicare Other | Attending: Emergency Medicine | Admitting: Emergency Medicine

## 2013-06-27 ENCOUNTER — Encounter (HOSPITAL_COMMUNITY): Payer: Self-pay | Admitting: Emergency Medicine

## 2013-06-27 DIAGNOSIS — S0990XA Unspecified injury of head, initial encounter: Secondary | ICD-10-CM | POA: Insufficient documentation

## 2013-06-27 DIAGNOSIS — N289 Disorder of kidney and ureter, unspecified: Secondary | ICD-10-CM

## 2013-06-27 DIAGNOSIS — F0280 Dementia in other diseases classified elsewhere without behavioral disturbance: Secondary | ICD-10-CM | POA: Insufficient documentation

## 2013-06-27 DIAGNOSIS — Z880 Allergy status to penicillin: Secondary | ICD-10-CM | POA: Insufficient documentation

## 2013-06-27 DIAGNOSIS — Z8639 Personal history of other endocrine, nutritional and metabolic disease: Secondary | ICD-10-CM | POA: Insufficient documentation

## 2013-06-27 DIAGNOSIS — Z8701 Personal history of pneumonia (recurrent): Secondary | ICD-10-CM | POA: Insufficient documentation

## 2013-06-27 DIAGNOSIS — Z862 Personal history of diseases of the blood and blood-forming organs and certain disorders involving the immune mechanism: Secondary | ICD-10-CM | POA: Insufficient documentation

## 2013-06-27 DIAGNOSIS — N189 Chronic kidney disease, unspecified: Secondary | ICD-10-CM | POA: Insufficient documentation

## 2013-06-27 DIAGNOSIS — I129 Hypertensive chronic kidney disease with stage 1 through stage 4 chronic kidney disease, or unspecified chronic kidney disease: Secondary | ICD-10-CM | POA: Insufficient documentation

## 2013-06-27 DIAGNOSIS — W1809XA Striking against other object with subsequent fall, initial encounter: Secondary | ICD-10-CM | POA: Insufficient documentation

## 2013-06-27 DIAGNOSIS — Y9389 Activity, other specified: Secondary | ICD-10-CM | POA: Insufficient documentation

## 2013-06-27 DIAGNOSIS — Z79899 Other long term (current) drug therapy: Secondary | ICD-10-CM | POA: Insufficient documentation

## 2013-06-27 DIAGNOSIS — W19XXXA Unspecified fall, initial encounter: Secondary | ICD-10-CM

## 2013-06-27 DIAGNOSIS — Z8719 Personal history of other diseases of the digestive system: Secondary | ICD-10-CM | POA: Insufficient documentation

## 2013-06-27 DIAGNOSIS — G309 Alzheimer's disease, unspecified: Secondary | ICD-10-CM | POA: Insufficient documentation

## 2013-06-27 DIAGNOSIS — Y9210 Unspecified residential institution as the place of occurrence of the external cause: Secondary | ICD-10-CM | POA: Insufficient documentation

## 2013-06-27 LAB — URINALYSIS, ROUTINE W REFLEX MICROSCOPIC
Glucose, UA: NEGATIVE mg/dL
Hgb urine dipstick: NEGATIVE
Ketones, ur: NEGATIVE mg/dL
Leukocytes, UA: NEGATIVE
Nitrite: NEGATIVE
Protein, ur: NEGATIVE mg/dL
Specific Gravity, Urine: 1.024 (ref 1.005–1.030)
Urobilinogen, UA: 2 mg/dL — ABNORMAL HIGH (ref 0.0–1.0)

## 2013-06-27 LAB — CBC WITH DIFFERENTIAL/PLATELET
Basophils Absolute: 0 10*3/uL (ref 0.0–0.1)
HCT: 39.1 % (ref 36.0–46.0)
Hemoglobin: 13.3 g/dL (ref 12.0–15.0)
Lymphocytes Relative: 25 % (ref 12–46)
Monocytes Absolute: 0.6 10*3/uL (ref 0.1–1.0)
Monocytes Relative: 8 % (ref 3–12)
Neutro Abs: 5 10*3/uL (ref 1.7–7.7)
Neutrophils Relative %: 64 % (ref 43–77)
RBC: 4.27 MIL/uL (ref 3.87–5.11)
RDW: 14.1 % (ref 11.5–15.5)
WBC: 7.8 10*3/uL (ref 4.0–10.5)

## 2013-06-27 LAB — POCT I-STAT, CHEM 8
BUN: 43 mg/dL — ABNORMAL HIGH (ref 6–23)
Calcium, Ion: 1.21 mmol/L (ref 1.13–1.30)
Creatinine, Ser: 1.7 mg/dL — ABNORMAL HIGH (ref 0.50–1.10)
Glucose, Bld: 88 mg/dL (ref 70–99)
HCT: 41 % (ref 36.0–46.0)
Hemoglobin: 13.9 g/dL (ref 12.0–15.0)
Potassium: 4.9 mEq/L (ref 3.5–5.1)
Sodium: 139 mEq/L (ref 135–145)
TCO2: 21 mmol/L (ref 0–100)

## 2013-06-27 MED ORDER — ZINC OXIDE 40 % EX OINT: TOPICAL_OINTMENT | Freq: Every day | CUTANEOUS | Status: DC

## 2013-06-27 MED ORDER — SODIUM CHLORIDE 0.9 % IV BOLUS (SEPSIS)
1000.0000 mL | Freq: Once | INTRAVENOUS | Status: AC
  Administered 2013-06-27: 1000 mL via INTRAVENOUS

## 2013-06-27 NOTE — ED Provider Notes (Signed)
CSN: [id number]     Arrival date & time 06/27/13  1341 History   First MD Initiated Contact with Patient 06/27/13 1504     Chief Complaint  Patient presents with  . Fall   (Consider location/radiation/quality/duration/timing/severity/associated sxs/prior Treatment) HPI  79 year old female, hx of Alzheimer, tachycardia,  who was brought here via EMS for evaluation of a recent fall. History obtained through EMS and daughter who is at bedside. Daughter was notified by nursing staff at the [facility] that [facility] at that pt had fallen today in the cafeteria while putting a tray up. EMS note indicate that pt was getting up from a round table in the cafeteria, trip over the edge of table, fell forward hitting hitting her forehead. Level V caveat applied due to advance Alzheimer.  Pt currently only complaining of pain to top of head.  Unsure LOC.  Pt has several falls in the past, usually related to having UTI.  Last hospitalization was a few months ago.  Not on blood thinner medication.  Pt usually walks with walker.    Past Medical History  Diagnosis Date  . Hypertension   . Alzheimer's dementia   . Pneumonia   . Gout   . Tachycardia   . Chronic renal insufficiency   . Hernia    Past Surgical History  Procedure Laterality Date  . Cholecystectomy    . Abdominal hysterectomy    . Colon surgery    . Stomach surgery     History reviewed. No pertinent family history. History  Substance Use Topics  . Smoking status: Never Smoker   . Smokeless tobacco: Never Used  . Alcohol Use: No   OB History   Grav Para Term Preterm Abortions TAB SAB Ect Mult Living            3     Review of Systems  Unable to perform ROS: Dementia    Allergies  Cephalosporins; Macrolides and ketolides; and Penicillins  Home Medications   Current Outpatient Rx  Name  Route  Sig  Dispense  Refill  . acetaminophen (TYLENOL) 500 MG tablet   Oral   Take 500 mg by mouth every 4 (four) hours as needed for  pain or fever.          Marland Kitchen alum & mag hydroxide-simeth (MAALOX/MYLANTA) 200-200-20 MG/5ML suspension   Oral   Take 30 mLs by mouth every 6 (six) hours as needed for indigestion.         Marland Kitchen amLODipine (NORVASC) 5 MG tablet   Oral   Take 5 mg by mouth every morning.          . Artificial Tear Ointment (ARTIFICIAL TEARS) ointment   Both Eyes   Place 1 application into both eyes 3 (three) times daily.          . calcium carbonate (OS-CAL) 600 MG TABS   Oral   Take 600 mg by mouth every morning.          . divalproex (DEPAKOTE SPRINKLE) 125 MG capsule   Oral   Take 250 mg by mouth 4 (four) times daily.         Marland Kitchen FLUoxetine (PROZAC) 20 MG capsule   Oral   Take 20 mg by mouth daily.         Marland Kitchen guaiFENesin (ROBITUSSIN) 100 MG/5ML liquid   Oral   Take 200 mg by mouth every 6 (six) hours as needed for cough. Do not exceed 4 doses         .  Lactobacillus (ACIDOPHILUS) TABS   Oral   Take 1 tablet by mouth every morning.         . loperamide (IMODIUM A-D) 2 MG tablet   Oral   Take 2 mg by mouth 4 (four) times daily as needed for diarrhea or loose stools.         Marland Kitchen LORazepam (ATIVAN PO)   Topical   Apply 0.5-1 mg topically every 6 (six) hours as needed (agitation and unable to take oral form).         . magnesium hydroxide (MILK OF MAGNESIA) 400 MG/5ML suspension   Oral   Take 30 mLs by mouth at bedtime as needed for constipation.          . mirtazapine (REMERON) 30 MG tablet   Oral   Take 30 mg by mouth at bedtime.         Marland Kitchen OLANZapine (ZYPREXA) 7.5 MG tablet   Oral   Take 7.5 mg by mouth at bedtime.         Marland Kitchen omeprazole (PRILOSEC) 20 MG capsule   Oral   Take 20 mg by mouth 2 (two) times daily. At 0800 an d1600         . potassium chloride SA (K-DUR,KLOR-CON) 20 MEQ tablet   Oral   Take 20 mEq by mouth every morning.          . rivastigmine (EXELON) 4.6 mg/24hr   Transdermal   Place 1 patch onto the skin every morning.          .  sulfamethoxazole-trimethoprim (BACTRIM DS) 800-160 MG per tablet   Oral   Take 1 tablet by mouth 2 (two) times daily. 10 days starting 11/3         . vitamin B-12 (CYANOCOBALAMIN) 1000 MCG tablet   Oral   Take 1,000 mcg by mouth every morning.           There were no vitals taken for this visit. Physical Exam  Nursing note and vitals reviewed. Constitutional:  Elderly female, appears to be in no acute distress.    HENT:  Head: Normocephalic and atraumatic.  Mouth/Throat: Oropharynx is clear and moist.  Scalp nontender on exam, no evidence of trauma. No midface tenderness  Eyes: Conjunctivae are normal.  Neck: Normal range of motion. Neck supple.  Cardiovascular: Normal rate, regular rhythm and intact distal pulses.   Pulmonary/Chest: Effort normal and breath sounds normal. No respiratory distress. She has no wheezes. She exhibits no tenderness.  Abdominal: Soft. There is no tenderness.  Musculoskeletal: She exhibits no tenderness (no midline spine tenderness).  5/5 strength to all 4 extremities with FROM.    Neurological:  Advance dementia: unable to recall name, place, time, or situation.  (baseline)    Skin: No rash noted.  Psychiatric: She has a normal mood and affect.    ED Course  Procedures (including critical care time)  3:27 PM Pt fell at [facility].  Presumably Low impact fall, no point tenderness or deformity on exam.  Work up initiated.  Hx limited due to baseline dementia.  Daughter felt pt is mentating at her baseline, also mentioned each time she was evaluate in ED for falls, she subsequently diagnosed with UTI.    I did offer to obtain a head CT scan, since pt did c/o pain to vertex of head, however, pt did not show any discomfort on my exam of scalp, and daughter do not think CT is necessary.  4:18 PM Pt has evidence of renal insufficiency with BUN 43, Cr 1.7, elevated from her baseline.  IVF given here in ER.  Normal orthostatic VS.    4:33  PM` Patient's labs otherwise reassuring. UA shows no evidence of urinary tract infection. She is afebrile stable normal vital sign. Minor head injury without apparent traumatic injury, and is at baseline mental status.  Patient will be sent back to her nursing facility after receiving IV fluid here. Patient otherwise able to tolerates PO.    5:42 PM Daughter request for cream to apply near perineal region to help prevent skin breakdown.  Desitin ointment prescribed with specific instruction for nursing facility to carry out.    Labs Review Labs Reviewed  URINALYSIS, ROUTINE W REFLEX MICROSCOPIC - Abnormal; Notable for the following:    Urobilinogen, UA 2.0 (*)    All other components within normal limits  POCT I-STAT, CHEM 8 - Abnormal; Notable for the following:    BUN 43 (*)    Creatinine, Ser 1.70 (*)    All other components within normal limits  CBC WITH DIFFERENTIAL  TROPONIN I   Imaging Review No results found.  EKG Interpretation     Ventricular Rate:  82 PR Interval:  168 QRS Duration: 146 QT Interval:  437 QTC Calculation: 510 R Axis:   67 Text Interpretation:  Sinus rhythm Right bundle branch block Inferior infarct, old No significant change since last tracing            MDM   1. Fall at nursing home, initial encounter   2. Renal insufficiency    BP 124/64  Pulse 79  Temp(Src) 98.9 F (37.2 C) (Oral)  Resp 18  SpO2 100%     Fayrene Helper, PA-C 06/27/13 1724  Fayrene Helper, PA-C 06/27/13 1743

## 2013-06-27 NOTE — ED Provider Notes (Signed)
Medical screening examination/treatment/procedure(s) were conducted as a shared visit with non-physician practitioner(s) and myself.  I personally evaluated the patient during the encounter.  EKG Interpretation    Date/Time:    Ventricular Rate:  82 PR Interval:  168 QRS Duration: 146 QT Interval:  437 QTC Calculation: 510 R Axis:   67 Text Interpretation:  Sinus rhythm Right bundle branch block Inferior infarct, old No significant change since last tracing           Patient not on anticoagulants, had a witnessed minor head injury without change in mental status or apparent neurologic function, patient continues to act at baseline according to family, patient has no headache or neck pain or cervical spine tenderness or apparent injury, family prefers no head CT scan or other imaging.  Hurman Horn, MD 07/02/13 [phone]

## 2013-06-27 NOTE — ED Notes (Signed)
Bed: WA21 Expected date:  Expected time:  Means of arrival:  Comments: Fall, no complaints

## 2013-06-27 NOTE — ED Notes (Signed)
Per EMS-pt c/o of fall this am while putting tray up in cafeteria. Denies pain. Hx Alzheimer's.

## 2013-07-08 ENCOUNTER — Encounter (HOSPITAL_COMMUNITY): Payer: Self-pay | Admitting: Emergency Medicine

## 2013-07-08 ENCOUNTER — Emergency Department (HOSPITAL_COMMUNITY)
Admission: EM | Admit: 2013-07-08 | Discharge: 2013-07-08 | Disposition: A | Discharge status: Home / Self Care | Payer: Medicare Other | Attending: Emergency Medicine | Admitting: Emergency Medicine

## 2013-07-08 DIAGNOSIS — W1809XA Striking against other object with subsequent fall, initial encounter: Secondary | ICD-10-CM | POA: Insufficient documentation

## 2013-07-08 DIAGNOSIS — M109 Gout, unspecified: Secondary | ICD-10-CM | POA: Insufficient documentation

## 2013-07-08 DIAGNOSIS — F0280 Dementia in other diseases classified elsewhere without behavioral disturbance: Secondary | ICD-10-CM | POA: Insufficient documentation

## 2013-07-08 DIAGNOSIS — I129 Hypertensive chronic kidney disease with stage 1 through stage 4 chronic kidney disease, or unspecified chronic kidney disease: Secondary | ICD-10-CM | POA: Insufficient documentation

## 2013-07-08 DIAGNOSIS — Y9389 Activity, other specified: Secondary | ICD-10-CM | POA: Insufficient documentation

## 2013-07-08 DIAGNOSIS — S0093XA Contusion of unspecified part of head, initial encounter: Secondary | ICD-10-CM

## 2013-07-08 DIAGNOSIS — Z880 Allergy status to penicillin: Secondary | ICD-10-CM | POA: Insufficient documentation

## 2013-07-08 DIAGNOSIS — Z9181 History of falling: Secondary | ICD-10-CM | POA: Insufficient documentation

## 2013-07-08 DIAGNOSIS — Z79899 Other long term (current) drug therapy: Secondary | ICD-10-CM | POA: Insufficient documentation

## 2013-07-08 DIAGNOSIS — Y9210 Unspecified residential institution as the place of occurrence of the external cause: Secondary | ICD-10-CM | POA: Insufficient documentation

## 2013-07-08 DIAGNOSIS — W19XXXA Unspecified fall, initial encounter: Secondary | ICD-10-CM

## 2013-07-08 DIAGNOSIS — N189 Chronic kidney disease, unspecified: Secondary | ICD-10-CM | POA: Insufficient documentation

## 2013-07-08 DIAGNOSIS — S0003XA Contusion of scalp, initial encounter: Secondary | ICD-10-CM | POA: Insufficient documentation

## 2013-07-08 DIAGNOSIS — G309 Alzheimer's disease, unspecified: Secondary | ICD-10-CM | POA: Insufficient documentation

## 2013-07-08 DIAGNOSIS — N390 Urinary tract infection, site not specified: Secondary | ICD-10-CM | POA: Insufficient documentation

## 2013-07-08 LAB — COMPREHENSIVE METABOLIC PANEL
ALT: 22 U/L (ref 0–35)
AST: 29 U/L (ref 0–37)
Albumin: 2.8 g/dL — ABNORMAL LOW (ref 3.5–5.2)
Alkaline Phosphatase: 176 U/L — ABNORMAL HIGH (ref 39–117)
CO2: 24 mEq/L (ref 19–32)
Calcium: 9.1 mg/dL (ref 8.4–10.5)
GFR calc non Af Amer: 49 mL/min — ABNORMAL LOW (ref >90)
Glucose, Bld: 114 mg/dL — ABNORMAL HIGH (ref 70–99)
Potassium: 4.3 mEq/L (ref 3.5–5.1)
Sodium: 141 mEq/L (ref 135–145)
Total Protein: 6.8 g/dL (ref 6.0–8.3)

## 2013-07-08 LAB — CBC WITH DIFFERENTIAL/PLATELET
Basophils Absolute: 0 10*3/uL (ref 0.0–0.1)
Basophils Relative: 1 % (ref 0–1)
Eosinophils Relative: 2 % (ref 0–5)
Lymphocytes Relative: 22 % (ref 12–46)
Lymphs Abs: 1.3 10*3/uL (ref 0.7–4.0)
MCV: 93.1 fL (ref 78.0–100.0)
Neutrophils Relative %: 68 % (ref 43–77)
Platelets: 228 10*3/uL (ref 150–400)
RBC: 3.91 MIL/uL (ref 3.87–5.11)
RDW: 13.9 % (ref 11.5–15.5)
WBC: 5.8 10*3/uL (ref 4.0–10.5)

## 2013-07-08 LAB — URINALYSIS, ROUTINE W REFLEX MICROSCOPIC
Glucose, UA: NEGATIVE mg/dL
Ketones, ur: NEGATIVE mg/dL
Nitrite: POSITIVE — AB
Protein, ur: NEGATIVE mg/dL
Specific Gravity, Urine: 1.022 (ref 1.005–1.030)
Urobilinogen, UA: 4 mg/dL — ABNORMAL HIGH (ref 0.0–1.0)
pH: 6 (ref 5.0–8.0)

## 2013-07-08 LAB — URINE MICROSCOPIC-ADD ON

## 2013-07-08 MED ORDER — SULFAMETHOXAZOLE-TRIMETHOPRIM 800-160 MG PO TABS: 1.0000 | ORAL_TABLET | Freq: Two times a day (BID) | ORAL | Status: AC

## 2013-07-08 MED ORDER — SULFAMETHOXAZOLE-TMP DS 800-160 MG PO TABS
1.0000 | ORAL_TABLET | Freq: Once | ORAL | Status: AC
  Administered 2013-07-08: 1 via ORAL
  Filled 2013-07-08: qty 1

## 2013-07-08 NOTE — ED Notes (Signed)
Bed: WU98 Expected date:  Expected time:  Means of arrival:  Comments: ems- 79 yo F, fall

## 2013-07-08 NOTE — ED Provider Notes (Signed)
CSN: [id number]     Arrival date & time 07/08/13  1448 History   First MD Initiated Contact with Patient 07/08/13 1503     Chief Complaint  Patient presents with  . Fall   Level V caveat for dementia  (Consider location/radiation/quality/duration/timing/severity/associated sxs/prior Treatment) HPI  Pt presents from her NH, daughters state they were called by her nursing home patient was trying to sit down and missed her chair or somehow fell and she hit her head on the wall. There was no loss of consciousness. Patient denies any pain or injury from the fall today. She points to the back of her right head and states that is where she hit her head. Family states she is at her baseline. They do report she seems to be getting weak throughout this past week and having some difficulty walking. They report she has had frequent urinary tract infections in the past.  PCP Dr Sherryll Burger in [place]  Past Medical History  Diagnosis Date  . Hypertension   . Alzheimer's dementia   . Pneumonia   . Gout   . Tachycardia   . Chronic renal insufficiency   . Hernia    Past Surgical History  Procedure Laterality Date  . Cholecystectomy    . Abdominal hysterectomy    . Colon surgery    . Stomach surgery     No family history on file. History  Substance Use Topics  . Smoking status: Never Smoker   . Smokeless tobacco: Never Used  . Alcohol Use: No   Live in NH  OB History   Grav Para Term Preterm Abortions TAB SAB Ect Mult Living            3     Review of Systems  Unable to perform ROS: Dementia    Allergies  Cephalosporins; Macrolides and ketolides; and Penicillins  Home Medications   Current Outpatient Rx  Name  Route  Sig  Dispense  Refill  . acetaminophen (TYLENOL) 500 MG tablet   Oral   Take 500 mg by mouth every 4 (four) hours as needed for pain or fever.          Marland Kitchen alum & mag hydroxide-simeth (MAALOX/MYLANTA) 200-200-20 MG/5ML suspension   Oral   Take 30 mLs by mouth every 6  (six) hours as needed for indigestion.         Marland Kitchen amLODipine (NORVASC) 5 MG tablet   Oral   Take 5 mg by mouth every morning.          . Artificial Tear Ointment (ARTIFICIAL TEARS) ointment   Both Eyes   Place 1 application into both eyes 2 (two) times daily.          . calcium carbonate (OS-CAL) 600 MG TABS   Oral   Take 600 mg by mouth every morning.          . Cholecalciferol (VITAMIN D3) 5000 UNITS TABS   Oral   Take 1 tablet by mouth every morning.         . clotrimazole (LOTRIMIN) 1 % cream   Topical   Apply 1 application topically 2 (two) times daily as needed (rash).         . divalproex (DEPAKOTE SPRINKLE) 125 MG capsule   Oral   Take 250 mg by mouth 4 (four) times daily.         . Eye Patch MISC   Right Eye   Place 1 application into the right eye at  bedtime.         Marland Kitchen FLUoxetine (PROZAC) 20 MG capsule   Oral   Take 20 mg by mouth every morning.          Marland Kitchen guaiFENesin (ROBITUSSIN) 100 MG/5ML liquid   Oral   Take 200 mg by mouth every 6 (six) hours as needed for cough. Do not exceed 4 doses         . hydroxypropyl methylcellulose (ISOPTO TEARS) 2.5 % ophthalmic solution   Both Eyes   Place 2 drops into both eyes 3 (three) times daily.         . Lactobacillus (ACIDOPHILUS) TABS   Oral   Take 1 tablet by mouth every morning.         . loperamide (IMODIUM A-D) 2 MG tablet   Oral   Take 2 mg by mouth 4 (four) times daily as needed for diarrhea or loose stools.         Marland Kitchen LORazepam (ATIVAN PO)   Topical   Apply 0.5-1 mg topically every 6 (six) hours as needed (agitation and unable to take oral form).         . magnesium hydroxide (MILK OF MAGNESIA) 400 MG/5ML suspension   Oral   Take 30 mLs by mouth at bedtime as needed for constipation.          . mirtazapine (REMERON) 30 MG tablet   Oral   Take 30 mg by mouth at bedtime.         Marland Kitchen OLANZapine (ZYPREXA) 7.5 MG tablet   Oral   Take 7.5 mg by mouth at bedtime.           Marland Kitchen omeprazole (PRILOSEC) 20 MG capsule   Oral   Take 20 mg by mouth 2 (two) times daily. At 0800 an 1600         . potassium chloride SA (K-DUR,KLOR-CON) 20 MEQ tablet   Oral   Take 20 mEq by mouth every morning.          . rivastigmine (EXELON) 4.6 mg/24hr   Transdermal   Place 1 patch onto the skin every morning. Remove old patch.         . vitamin B-12 (CYANOCOBALAMIN) 1000 MCG tablet   Oral   Take 1,000 mcg by mouth every morning.           BP 116/76  Pulse 89  Temp(Src) 98.1 F (36.7 C) (Oral)  Resp 12  SpO2 95%  Vital signs normal   Physical Exam  Nursing note and vitals reviewed. Constitutional: She is oriented to person, place, and time. She appears well-developed and well-nourished.  Non-toxic appearance. She does not appear ill. No distress.  Thin, frail, elderly female  HENT:  Head: Normocephalic and atraumatic.    Right Ear: External ear normal.  Left Ear: External ear normal.  Nose: Nose normal. No mucosal edema or rhinorrhea.  Mouth/Throat: Oropharynx is clear and moist and mucous membranes are normal. No dental abscesses or uvula swelling.  Area of tenderness noted, no swelling, abrasions  Eyes: Conjunctivae and EOM are normal. Pupils are equal, round, and reactive to light.  Neck: Normal range of motion and full passive range of motion without pain. Neck supple.  Pt has no pain to palpation of her cervical spine, she has no pain in her neck from ROM, C collar removed  Cardiovascular: Normal rate, regular rhythm and normal heart sounds.  Exam reveals no gallop and no friction rub.   No  murmur heard. Pulmonary/Chest: Effort normal and breath sounds normal. No respiratory distress. She has no wheezes. She has no rhonchi. She has no rales. She exhibits no tenderness and no crepitus.  Abdominal: Soft. Normal appearance and bowel sounds are normal. She exhibits no distension. There is no tenderness. There is no rebound and no guarding.   Musculoskeletal: Normal range of motion. She exhibits no edema and no tenderness.  Moves all extremities well with only her usual pain in her knees (daughters states no cartilage in her knees anymore)  Neurological: She is alert and oriented to person, place, and time. She has normal strength. No cranial nerve deficit.  Skin: Skin is warm, dry and intact. No rash noted. No erythema. No pallor.  Psychiatric: She has a normal mood and affect. Her speech is normal and behavior is normal. Her mood appears not anxious.    ED Course  Procedures (including critical care time) Medications  sulfamethoxazole-trimethoprim (BACTRIM DS) 800-160 MG per tablet 1 tablet (1 tablet Oral Given 07/08/13 1731)     Pt had a UTI on 6/15 and 7/11 with over 100K E Coli sensitive to cephalosporins, macrobid and septra, resistant to levaquin.  UA on 11/12 was normal but was still on antibiotics until the 14th from a UTI diagnosed on 11/4 at her nursing home.   Patient having frequent UTIs at least once a month. I'm going to place her on Septra DS which her 2 prior urine culture showing sensitivity to for a ten-day course to hopefully eradicate her UTI. Patient had no injury from her fall today. Family states she is at her baseline. She is laughing and joking with family no distress.  Labs Review Results for orders placed during the hospital encounter of 07/08/13  URINALYSIS, ROUTINE W REFLEX MICROSCOPIC      Result Value Range   Color, Urine YELLOW  YELLOW   APPearance CLOUDY (*) CLEAR   Specific Gravity, Urine 1.022  1.005 - 1.030   pH 6.0  5.0 - 8.0   Glucose, UA NEGATIVE  NEGATIVE mg/dL   Hgb urine dipstick NEGATIVE  NEGATIVE   Bilirubin Urine NEGATIVE  NEGATIVE   Ketones, ur NEGATIVE  NEGATIVE mg/dL   Protein, ur NEGATIVE  NEGATIVE mg/dL   Urobilinogen, UA 4.0 (*) 0.0 - 1.0 mg/dL   Nitrite POSITIVE (*) NEGATIVE   Leukocytes, UA LARGE (*) NEGATIVE  CBC WITH DIFFERENTIAL      Result Value Range   WBC  5.8  4.0 - 10.5 K/uL   RBC 3.91  3.87 - 5.11 MIL/uL   Hemoglobin 12.2  12.0 - 15.0 g/dL   HCT 16.1  09.6 - 04.5 %   MCV 93.1  78.0 - 100.0 fL   MCH 31.2  26.0 - 34.0 pg   MCHC 33.5  30.0 - 36.0 g/dL   RDW 40.9  81.1 - 91.4 %   Platelets 228  150 - 400 K/uL   Neutrophils Relative % 68  43 - 77 %   Neutro Abs 4.0  1.7 - 7.7 K/uL   Lymphocytes Relative 22  12 - 46 %   Lymphs Abs 1.3  0.7 - 4.0 K/uL   Monocytes Relative 7  3 - 12 %   Monocytes Absolute 0.4  0.1 - 1.0 K/uL   Eosinophils Relative 2  0 - 5 %   Eosinophils Absolute 0.1  0.0 - 0.7 K/uL   Basophils Relative 1  0 - 1 %   Basophils Absolute 0.0  0.0 -  0.1 K/uL  COMPREHENSIVE METABOLIC PANEL      Result Value Range   Sodium 141  135 - 145 mEq/L   Potassium 4.3  3.5 - 5.1 mEq/L   Chloride 107  96 - 112 mEq/L   CO2 24  19 - 32 mEq/L   Glucose, Bld 114 (*) 70 - 99 mg/dL   BUN 21  6 - 23 mg/dL   Creatinine, Ser 2.13  0.50 - 1.10 mg/dL   Calcium 9.1  8.4 - 08.6 mg/dL   Total Protein 6.8  6.0 - 8.3 g/dL   Albumin 2.8 (*) 3.5 - 5.2 g/dL   AST 29  0 - 37 U/L   ALT 22  0 - 35 U/L   Alkaline Phosphatase 176 (*) 39 - 117 U/L   Total Bilirubin 0.2 (*) 0.3 - 1.2 mg/dL   GFR calc non Af Amer 49 (*) >90 mL/min   GFR calc Af Amer 57 (*) >90 mL/min  VALPROIC ACID LEVEL      Result Value Range   Valproic Acid Lvl 43.0 (*) 50.0 - 100.0 ug/mL  URINE MICROSCOPIC-ADD ON      Result Value Range   WBC, UA TOO NUMEROUS TO COUNT  <3 WBC/hpf   Bacteria, UA MANY (*) RARE   Laboratory interpretation all normal except UTI, subtherapeutic depakote level    Imaging Review No results found.  EKG Interpretation   None       MDM   1. Fall at nursing home, initial encounter   2. UTI (urinary tract infection)   3. Contusion of head, initial encounter     New Prescriptions   SULFAMETHOXAZOLE-TRIMETHOPRIM (BACTRIM DS,SEPTRA DS) 800-160 MG PER TABLET    Take 1 tablet by mouth 2 (two) times daily.    Plan discharge   Devoria Albe, MD,  Franz Dell, MD 07/08/13 [phone]

## 2013-07-08 NOTE — ED Notes (Addendum)
Per EMS, pt came from [facility]. Pt fell while transitioning from walker to chair. Pt fell on right side and hit head on the wall. Denies LOC. Denies being on blood thinners. Pt hx of alzheimer's and dementia.

## 2013-07-08 NOTE — Progress Notes (Signed)
Per protocol, CSW called facility listed for pt to confirm residency and level of care.  Per Tanya Santana, confirmed that pt is a resident of [facility] in their Assisted Living on a memory care unit.   Should pt need a change of level of care, please ensure facility is aware. CSW available for consult.  [place] Spaniel [place], [phone]     ED CSW  4:32pm

## 2013-07-10 LAB — URINE CULTURE

## 2013-07-11 ENCOUNTER — Telehealth (HOSPITAL_BASED_OUTPATIENT_CLINIC_OR_DEPARTMENT_OTHER): Payer: Self-pay | Admitting: *Deleted

## 2013-07-11 NOTE — Telephone Encounter (Signed)
Pt had urine culture results for > 100,000 Colonies of E.coli in urine culture. Reviewed by pharmacist, M. Turner, Phd and no further treatment recommended.

## 2013-08-17 ENCOUNTER — Emergency Department (HOSPITAL_COMMUNITY): Payer: Medicare Other

## 2013-08-17 ENCOUNTER — Emergency Department (HOSPITAL_COMMUNITY)
Admission: EM | Admit: 2013-08-17 | Discharge: 2013-08-17 | Disposition: A | Discharge status: Nursing Home (Includes ICF) | Payer: Medicare Other | Attending: Emergency Medicine | Admitting: Emergency Medicine

## 2013-08-17 ENCOUNTER — Encounter (HOSPITAL_COMMUNITY): Payer: Self-pay | Admitting: Emergency Medicine

## 2013-08-17 DIAGNOSIS — T07XXXA Unspecified multiple injuries, initial encounter: Secondary | ICD-10-CM | POA: Insufficient documentation

## 2013-08-17 DIAGNOSIS — Z792 Long term (current) use of antibiotics: Secondary | ICD-10-CM | POA: Insufficient documentation

## 2013-08-17 DIAGNOSIS — G309 Alzheimer's disease, unspecified: Secondary | ICD-10-CM | POA: Insufficient documentation

## 2013-08-17 DIAGNOSIS — Z862 Personal history of diseases of the blood and blood-forming organs and certain disorders involving the immune mechanism: Secondary | ICD-10-CM | POA: Insufficient documentation

## 2013-08-17 DIAGNOSIS — F0280 Dementia in other diseases classified elsewhere without behavioral disturbance: Secondary | ICD-10-CM | POA: Insufficient documentation

## 2013-08-17 DIAGNOSIS — Y939 Activity, unspecified: Secondary | ICD-10-CM | POA: Insufficient documentation

## 2013-08-17 DIAGNOSIS — I129 Hypertensive chronic kidney disease with stage 1 through stage 4 chronic kidney disease, or unspecified chronic kidney disease: Secondary | ICD-10-CM | POA: Insufficient documentation

## 2013-08-17 DIAGNOSIS — W19XXXA Unspecified fall, initial encounter: Secondary | ICD-10-CM | POA: Insufficient documentation

## 2013-08-17 DIAGNOSIS — Z8701 Personal history of pneumonia (recurrent): Secondary | ICD-10-CM | POA: Insufficient documentation

## 2013-08-17 DIAGNOSIS — S79919A Unspecified injury of unspecified hip, initial encounter: Secondary | ICD-10-CM | POA: Insufficient documentation

## 2013-08-17 DIAGNOSIS — Z79899 Other long term (current) drug therapy: Secondary | ICD-10-CM | POA: Insufficient documentation

## 2013-08-17 DIAGNOSIS — Y9210 Unspecified residential institution as the place of occurrence of the external cause: Secondary | ICD-10-CM | POA: Insufficient documentation

## 2013-08-17 DIAGNOSIS — N189 Chronic kidney disease, unspecified: Secondary | ICD-10-CM | POA: Insufficient documentation

## 2013-08-17 DIAGNOSIS — Z880 Allergy status to penicillin: Secondary | ICD-10-CM | POA: Insufficient documentation

## 2013-08-17 DIAGNOSIS — Z8639 Personal history of other endocrine, nutritional and metabolic disease: Secondary | ICD-10-CM | POA: Insufficient documentation

## 2013-08-17 DIAGNOSIS — S79929A Unspecified injury of unspecified thigh, initial encounter: Secondary | ICD-10-CM

## 2013-08-17 LAB — CBC WITH DIFFERENTIAL/PLATELET
Basophils Absolute: 0 10*3/uL (ref 0.0–0.1)
Basophils Relative: 0 % (ref 0–1)
Eosinophils Absolute: 0.2 10*3/uL (ref 0.0–0.7)
Eosinophils Relative: 3 % (ref 0–5)
HCT: 36.4 % (ref 36.0–46.0)
Hemoglobin: 12.1 g/dL (ref 12.0–15.0)
LYMPHS ABS: 1.4 10*3/uL (ref 0.7–4.0)
Lymphocytes Relative: 26 % (ref 12–46)
MCH: 31.2 pg (ref 26.0–34.0)
MCHC: 33.2 g/dL (ref 30.0–36.0)
MCV: 93.8 fL (ref 78.0–100.0)
Monocytes Absolute: 0.4 10*3/uL (ref 0.1–1.0)
Monocytes Relative: 8 % (ref 3–12)
NEUTROS PCT: 63 % (ref 43–77)
Neutro Abs: 3.4 10*3/uL (ref 1.7–7.7)
PLATELETS: 194 10*3/uL (ref 150–400)
RBC: 3.88 MIL/uL (ref 3.87–5.11)
RDW: 14.1 % (ref 11.5–15.5)
WBC: 5.5 10*3/uL (ref 4.0–10.5)

## 2013-08-17 LAB — BASIC METABOLIC PANEL
BUN: 22 mg/dL (ref 6–23)
CHLORIDE: 103 meq/L (ref 96–112)
CO2: 22 mEq/L (ref 19–32)
Calcium: 9.2 mg/dL (ref 8.4–10.5)
Creatinine, Ser: 1.1 mg/dL (ref 0.50–1.10)
GFR calc Af Amer: 52 mL/min — ABNORMAL LOW (ref >90)
GFR calc non Af Amer: 45 mL/min — ABNORMAL LOW (ref >90)
Glucose, Bld: 95 mg/dL (ref 70–99)
Potassium: 4.5 mEq/L (ref 3.7–5.3)
SODIUM: 137 meq/L (ref 137–147)

## 2013-08-17 NOTE — ED Notes (Signed)
[facility] EMS [facility]. Nurse staff heard pt fall. Pt found on left side. Pt normally uses walker to get around. With EMS, pt grimaced with lumbar pressure. No redness or deformity noticed. Pt hx of dementia. Pt oriented to self. Pt cognitively at baseline.

## 2013-08-17 NOTE — ED Notes (Signed)
Upon assessment, pt reports pain with palpation to right hip.

## 2013-08-17 NOTE — ED Provider Notes (Signed)
CSN: [id number]     Arrival date & time 08/17/13  1655 History   First MD Initiated Contact with Patient 08/17/13 1657     Chief Complaint  Patient presents with  . Fall    HPI  Ms. Tanya Santana arrives via EMS. She has a history of profound Alzheimer's dementia. She lives in an extended care facility. She had an unwitnessed fall. She is found supine or left side lying in the hallway of her care facility. She was given a by mouth. EMS was called. Per EMS report she complained of pain in her right hip and her back. Again, an unwitnessed fall. She is immobilized in a cervical collar and long spine board and transferred here.  Daughter and son-in-law arrived shortly after her. She's had no recent illnesses. Is on no no blood thinners.  Past Medical History  Diagnosis Date  . Hypertension   . Alzheimer's dementia   . Pneumonia   . Gout   . Tachycardia   . Chronic renal insufficiency   . Hernia    Past Surgical History  Procedure Laterality Date  . Cholecystectomy    . Abdominal hysterectomy    . Colon surgery    . Stomach surgery     No family history on file. History  Substance Use Topics  . Smoking status: Never Smoker   . Smokeless tobacco: Never Used  . Alcohol Use: No   OB History   Grav Para Term Preterm Abortions TAB SAB Ect Mult Living            3     Review of Systems  Unable to perform ROS: Dementia   5 caveat for dementia  Allergies  Cephalosporins; Macrolides and ketolides; and Penicillins  Home Medications   Current Outpatient Rx  Name  Route  Sig  Dispense  Refill  . acetaminophen (TYLENOL) 500 MG tablet   Oral   Take 500 mg by mouth every 4 (four) hours as needed for pain or fever.          Marland Kitchen acidophilus (RISAQUAD) CAPS capsule   Oral   Take 1 capsule by mouth daily. For 20 days.         Marland Kitchen alum & mag hydroxide-simeth (MAALOX/MYLANTA) 200-200-20 MG/5ML suspension   Oral   Take 30 mLs by mouth every 6 (six) hours as needed for indigestion.         Marland Kitchen amLODipine (NORVASC) 5 MG tablet   Oral   Take 5 mg by mouth every morning.          . Artificial Tear Ointment (REFRESH LACRI-LUBE) OINT   Both Eyes   Place 1 application into both eyes 2 (two) times daily.         . calcium carbonate (OS-CAL) 600 MG TABS   Oral   Take 600 mg by mouth every morning.          . Cholecalciferol (VITAMIN D3) 5000 UNITS TABS   Oral   Take 1 tablet by mouth every morning.         . Cranberry 450 MG CAPS   Oral   Take 1 capsule by mouth 2 (two) times daily.         . divalproex (DEPAKOTE SPRINKLE) 125 MG capsule   Oral   Take 250 mg by mouth 4 (four) times daily.         . Eye Patch MISC   Right Eye   Place 1 application into the right eye  at bedtime.         Marland Kitchen FLUoxetine (PROZAC) 10 MG capsule   Oral   Take 10 mg by mouth daily. Take with 1 capsule orally every morning with 20 mg to equal  30 mg.         . FLUoxetine (PROZAC) 20 MG capsule   Oral   Take 20 mg by mouth every morning. Take 1 capsule every morning with 10 mg to equal 30 mg.         . guaiFENesin (ROBITUSSIN) 100 MG/5ML liquid   Oral   Take 200 mg by mouth every 6 (six) hours as needed for cough. Do not exceed 4 doses         . hydroxypropyl methylcellulose (ISOPTO TEARS) 2.5 % ophthalmic solution   Both Eyes   Place 2 drops into both eyes 3 (three) times daily.         . Lactobacillus (ACIDOPHILUS) TABS   Oral   Take 1 tablet by mouth every morning.         . loperamide (IMODIUM A-D) 2 MG tablet   Oral   Take 2 mg by mouth 4 (four) times daily as needed for diarrhea or loose stools.         . magnesium hydroxide (MILK OF MAGNESIA) 400 MG/5ML suspension   Oral   Take 30 mLs by mouth at bedtime as needed for constipation.          . mirtazapine (REMERON) 30 MG tablet   Oral   Take 30 mg by mouth at bedtime.         Marland Kitchen OLANZapine (ZYPREXA) 10 MG tablet   Oral   Take 5 mg by mouth 2 (two) times daily.         Marland Kitchen omeprazole  (PRILOSEC) 20 MG capsule   Oral   Take 20 mg by mouth 2 (two) times daily. At 0800 an 1600         . potassium chloride SA (K-DUR,KLOR-CON) 20 MEQ tablet   Oral   Take 20 mEq by mouth every morning.          Marland Kitchen PRESCRIPTION MEDICATION      Triamcinolone 0.1% cream/Ketoconazole 2% cream compounded 1:1. Apply under abdominal fold twice a day and place abdominal pad to prevent skin from touching skin.         Marland Kitchen PRESCRIPTION MEDICATION   Topical   Apply 0.5 mg topically every 6 (six) hours as needed (agitation.). Lorazepam 1 mg/ml topical gel.         . rivastigmine (EXELON) 9.5 mg/24hr   Transdermal   Place 9.5 mg onto the skin daily.         . vitamin B-12 (CYANOCOBALAMIN) 1000 MCG tablet   Oral   Take 1,000 mcg by mouth every morning.          . ciprofloxacin (CIPRO) 500 MG tablet   Oral   Take 500 mg by mouth 2 (two) times daily. For 10 days.          BP 132/80  Pulse 63  Temp(Src) 97.8 F (36.6 C) (Oral)  Resp 22  SpO2 100% Physical Exam  Constitutional: She is oriented to person, place, and time. No distress.  She's not repetitive. She is responsive.  HENT:  Head: Normocephalic.  No signs of trauma to the head or face  Eyes: Conjunctivae are normal. Pupils are equal, round, and reactive to light. No scleral icterus.  Neck: Normal range of  motion. Neck supple. No thyromegaly present.  Nontender to palpate in the neck. Maintained in cervical collar because of distracting pain in the hip and back  Cardiovascular: Normal rate, regular rhythm and normal heart sounds.  Exam reveals no gallop and no friction rub.   No murmur heard. Regular rhythm  Pulmonary/Chest: Effort normal and breath sounds normal. No respiratory distress. She has no wheezes. She has no rales.  Clear lungs. Complains of some tenderness in the right lateral ribs.  Abdominal: Soft. Bowel sounds are normal. She exhibits no distension. There is no tenderness. There is no rebound.  No pain  palpating the abdomen  Musculoskeletal: Normal range of motion.       Legs: No obvious shortening or rotation of the lower extremities.  Complains of discomfort to the majority of the thoracic and lumbar spine to palpate  Neurological: She is alert and oriented to person, place, and time.  To mention. Moves all 4 extremities. Complains of pain of the right lower extremity with movement.  Skin: Skin is warm and dry. No rash noted.  Psychiatric: She has a normal mood and affect. Her behavior is normal.    ED Course  Procedures (including critical care time) Labs Review Labs Reviewed  BASIC METABOLIC PANEL - Abnormal; Notable for the following:    GFR calc non Af Amer 45 (*)    GFR calc Af Amer 52 (*)    All other components within normal limits  CBC WITH DIFFERENTIAL  URINALYSIS, ROUTINE W REFLEX MICROSCOPIC   Imaging Review Dg Chest 1 View  08/17/2013   CLINICAL DATA:  Fall, chest pain  EXAM: CHEST - 1 VIEW  COMPARISON:  12/26/2011  FINDINGS: Heart size upper normal and stable. Aortic calcifications stable. Mild chronic stable interstitial prominence. No consolidation or effusion. Bony thorax appears to be intact.  IMPRESSION: No active disease.   Electronically Signed   By: Esperanza Heiraymond  Rubner M.D.   On: 08/17/2013 18:06   Dg Thoracic Spine 2 View  08/17/2013   CLINICAL DATA:  Fall, back pain  EXAM: THORACIC SPINE - 2 VIEW  COMPARISON:  None.  FINDINGS: Normal alignment. No paraspinous hematoma. No fracture. Mild multilevel degenerative disc disease.  IMPRESSION: No acute findings   Electronically Signed   By: Esperanza Heiraymond  Rubner M.D.   On: 08/17/2013 17:59   Dg Lumbar Spine Complete  08/17/2013   CLINICAL DATA:  Larey SeatFell.  Back pain.  EXAM: LUMBAR SPINE - COMPLETE 4+ VIEW  COMPARISON:  CT scan 07/15/2011.  FINDINGS: Normal alignment of the lumbar vertebral bodies. Stable advanced degenerative disc disease and facet disease in the lower lumbar spine. No acute fracture. The visualized bony pelvis is  intact.  IMPRESSION: Normal alignment and no acute bony findings.  Stable degenerative changes.   Electronically Signed   By: Loralie ChampagneMark  Gallerani M.D.   On: 08/17/2013 18:04   Dg Hip Complete Right  08/17/2013   CLINICAL DATA:  Larey SeatFell.  Right hip pain.  EXAM: RIGHT HIP - COMPLETE 2+ VIEW  COMPARISON:  CT scan 12/26/2011.  FINDINGS: Both hips are normally located. Moderate hip joint degenerative changes bilaterally. No acute fracture or AVN. Remote healed pubic rami fractures are noted. The pubic symphysis and SI joints are intact. No obvious sacral fractures.  IMPRESSION: No definite acute right hip fracture.  Remote healed pubic rami fractures on the right.  Bilateral hip joint degenerative changes.   Electronically Signed   By: Loralie ChampagneMark  Gallerani M.D.   On: 08/17/2013 18:01  Ct Head Wo Contrast  08/17/2013   CLINICAL DATA:  Fall.  Headache.  Hip fracture.  EXAM: CT HEAD WITHOUT CONTRAST  CT CERVICAL SPINE WITHOUT CONTRAST  TECHNIQUE: Multidetector CT imaging of the head and cervical spine was performed following the standard protocol without intravenous contrast. Multiplanar CT image reconstructions of the cervical spine were also generated.  COMPARISON:  Multiple exams, including 02/22/2013 and 01/24/2013  FINDINGS: CT HEAD FINDINGS  Periventricular white matter and corona radiata hypodensities favor chronic ischemic microvascular white matter disease. Remote left anterior thalamic lacunar infarct. Remote small lacunar infarcts in the left lentiform nucleus and left caudate head, unchanged.  No intracranial hemorrhage, mass lesion, or acute CVA.  CT CERVICAL SPINE FINDINGS  Incidental failure of fusion of the posterior arch of C1. Degenerative findings at the C1-2 articulation including spurring, particularly on the left side.  Multilevel facet arthropathy noted with loss of disc height at C3-4 and especially C5-6 and C6-7. There is 1.5 mm of degenerative anterior subluxation at C4-5. There is osseous foraminal  stenosis on the right at C5-6, and potential mild osseous foraminal stenosis on the right at C6-7 and on the left side at C5-6 and C6-7.  No prevertebral soft tissue swelling. No cervical spine fracture observed.  There is interstitial accentuation at the lung apices. A partially rim calcified 2.6 x 2.1 cm right posterior thyroid lesion is present along with several other thyroid nodules.  IMPRESSION: 1. No acute intracranial findings. Periventricular white matter and corona radiata hypodensities favor chronic ischemic microvascular white matter disease. Remote lacunar infarcts in the left basal ganglia and left anterior thalamus. 2. Cervical spondylosis potentially causing chronic osseous foraminal impingement at several levels. No fracture or acute subluxation observed. 3. 2.6 x 2.1 cm right posterior thyroid lesion. Consider further evaluation with thyroid ultrasound. If patient is clinically hyperthyroid, consider nuclear medicine thyroid uptake and scan.   Electronically Signed   By: Herbie Baltimore M.D.   On: 08/17/2013 18:25   Ct Cervical Spine Wo Contrast  08/17/2013   CLINICAL DATA:  Fall.  Headache.  Hip fracture.  EXAM: CT HEAD WITHOUT CONTRAST  CT CERVICAL SPINE WITHOUT CONTRAST  TECHNIQUE: Multidetector CT imaging of the head and cervical spine was performed following the standard protocol without intravenous contrast. Multiplanar CT image reconstructions of the cervical spine were also generated.  COMPARISON:  Multiple exams, including 02/22/2013 and 01/24/2013  FINDINGS: CT HEAD FINDINGS  Periventricular white matter and corona radiata hypodensities favor chronic ischemic microvascular white matter disease. Remote left anterior thalamic lacunar infarct. Remote small lacunar infarcts in the left lentiform nucleus and left caudate head, unchanged.  No intracranial hemorrhage, mass lesion, or acute CVA.  CT CERVICAL SPINE FINDINGS  Incidental failure of fusion of the posterior arch of C1. Degenerative  findings at the C1-2 articulation including spurring, particularly on the left side.  Multilevel facet arthropathy noted with loss of disc height at C3-4 and especially C5-6 and C6-7. There is 1.5 mm of degenerative anterior subluxation at C4-5. There is osseous foraminal stenosis on the right at C5-6, and potential mild osseous foraminal stenosis on the right at C6-7 and on the left side at C5-6 and C6-7.  No prevertebral soft tissue swelling. No cervical spine fracture observed.  There is interstitial accentuation at the lung apices. A partially rim calcified 2.6 x 2.1 cm right posterior thyroid lesion is present along with several other thyroid nodules.  IMPRESSION: 1. No acute intracranial findings. Periventricular white matter and corona radiata  hypodensities favor chronic ischemic microvascular white matter disease. Remote lacunar infarcts in the left basal ganglia and left anterior thalamus. 2. Cervical spondylosis potentially causing chronic osseous foraminal impingement at several levels. No fracture or acute subluxation observed. 3. 2.6 x 2.1 cm right posterior thyroid lesion. Consider further evaluation with thyroid ultrasound. If patient is clinically hyperthyroid, consider nuclear medicine thyroid uptake and scan.   Electronically Signed   By: Herbie Baltimore M.D.   On: 08/17/2013 18:25    EKG Interpretation   None       MDM   1. Multiple contusions    Her x-rays and CT scan showed no acute findings. No acute abnormalities of hemoglobin her electrolytes. She is currently on treatment with Cipro for a urinary tract infection diagnosed 2 days ago. He'll feel she needs repeat urinalysis or culture today. Although demented, her level of consciousness has remained normal here. She is interactive with family. Patient is appropriate for transfer back to her memory care facility.     Rolland Porter, MD 08/17/13 [phone]

## 2013-08-17 NOTE — ED Notes (Signed)
Urine to be collected after xray.

## 2013-08-17 NOTE — Discharge Instructions (Signed)
Contusion  A contusion is a deep bruise. Contusions happen when an injury causes bleeding under the skin. Signs of bruising include pain, puffiness (swelling), and discolored skin. The contusion may turn blue, purple, or yellow.  HOME CARE   · Put ice on the injured area.  · Put ice in a plastic bag.  · Place a towel between your skin and the bag.  · Leave the ice on for 15-20 minutes, 03-04 times a day.  · Only take medicine as told by your doctor.  · Rest the injured area.  · If possible, raise (elevate) the injured area to lessen puffiness.  GET HELP RIGHT AWAY IF:   · You have more bruising or puffiness.  · You have pain that is getting worse.  · Your puffiness or pain is not helped by medicine.  MAKE SURE YOU:   · Understand these instructions.  · Will watch your condition.  · Will get help right away if you are not doing well or get worse.  Document Released: 01/19/2008 Document Revised: 10/25/2011 Document Reviewed: 06/07/2011  [employer]® Patient Information ©2014 [employer].

## 2013-08-17 NOTE — ED Notes (Signed)
Bed: WA13 Expected date:  Expected time:  Means of arrival:  Comments: EMS: Fall 

## 2013-09-30 ENCOUNTER — Emergency Department (HOSPITAL_COMMUNITY): Payer: Medicare Other

## 2013-09-30 ENCOUNTER — Emergency Department (HOSPITAL_COMMUNITY)
Admission: EM | Admit: 2013-09-30 | Discharge: 2013-09-30 | Disposition: A | Discharge status: Home / Self Care | Payer: Medicare Other | Attending: Emergency Medicine | Admitting: Emergency Medicine

## 2013-09-30 ENCOUNTER — Encounter (HOSPITAL_COMMUNITY): Payer: Self-pay | Admitting: Emergency Medicine

## 2013-09-30 DIAGNOSIS — Z8719 Personal history of other diseases of the digestive system: Secondary | ICD-10-CM | POA: Insufficient documentation

## 2013-09-30 DIAGNOSIS — Z79899 Other long term (current) drug therapy: Secondary | ICD-10-CM | POA: Insufficient documentation

## 2013-09-30 DIAGNOSIS — Y929 Unspecified place or not applicable: Secondary | ICD-10-CM | POA: Insufficient documentation

## 2013-09-30 DIAGNOSIS — Y939 Activity, unspecified: Secondary | ICD-10-CM | POA: Insufficient documentation

## 2013-09-30 DIAGNOSIS — Z9089 Acquired absence of other organs: Secondary | ICD-10-CM | POA: Insufficient documentation

## 2013-09-30 DIAGNOSIS — S0180XA Unspecified open wound of other part of head, initial encounter: Secondary | ICD-10-CM | POA: Insufficient documentation

## 2013-09-30 DIAGNOSIS — I129 Hypertensive chronic kidney disease with stage 1 through stage 4 chronic kidney disease, or unspecified chronic kidney disease: Secondary | ICD-10-CM | POA: Insufficient documentation

## 2013-09-30 DIAGNOSIS — Z880 Allergy status to penicillin: Secondary | ICD-10-CM | POA: Insufficient documentation

## 2013-09-30 DIAGNOSIS — Z8701 Personal history of pneumonia (recurrent): Secondary | ICD-10-CM | POA: Insufficient documentation

## 2013-09-30 DIAGNOSIS — S0181XA Laceration without foreign body of other part of head, initial encounter: Secondary | ICD-10-CM

## 2013-09-30 DIAGNOSIS — N189 Chronic kidney disease, unspecified: Secondary | ICD-10-CM | POA: Insufficient documentation

## 2013-09-30 DIAGNOSIS — Z8639 Personal history of other endocrine, nutritional and metabolic disease: Secondary | ICD-10-CM | POA: Insufficient documentation

## 2013-09-30 DIAGNOSIS — W19XXXA Unspecified fall, initial encounter: Secondary | ICD-10-CM | POA: Insufficient documentation

## 2013-09-30 DIAGNOSIS — Z862 Personal history of diseases of the blood and blood-forming organs and certain disorders involving the immune mechanism: Secondary | ICD-10-CM | POA: Insufficient documentation

## 2013-09-30 DIAGNOSIS — N390 Urinary tract infection, site not specified: Secondary | ICD-10-CM | POA: Insufficient documentation

## 2013-09-30 DIAGNOSIS — F0280 Dementia in other diseases classified elsewhere without behavioral disturbance: Secondary | ICD-10-CM | POA: Insufficient documentation

## 2013-09-30 DIAGNOSIS — G309 Alzheimer's disease, unspecified: Secondary | ICD-10-CM | POA: Insufficient documentation

## 2013-09-30 LAB — CBC WITH DIFFERENTIAL/PLATELET
Basophils Absolute: 0 10*3/uL (ref 0.0–0.1)
Basophils Relative: 1 % (ref 0–1)
EOS ABS: 0.1 10*3/uL (ref 0.0–0.7)
Eosinophils Relative: 2 % (ref 0–5)
HEMATOCRIT: 37.5 % (ref 36.0–46.0)
HEMOGLOBIN: 12.6 g/dL (ref 12.0–15.0)
LYMPHS ABS: 1 10*3/uL (ref 0.7–4.0)
Lymphocytes Relative: 18 % (ref 12–46)
MCH: 31.5 pg (ref 26.0–34.0)
MCHC: 33.6 g/dL (ref 30.0–36.0)
MCV: 93.8 fL (ref 78.0–100.0)
MONO ABS: 0.5 10*3/uL (ref 0.1–1.0)
MONOS PCT: 8 % (ref 3–12)
NEUTROS PCT: 71 % (ref 43–77)
Neutro Abs: 3.9 10*3/uL (ref 1.7–7.7)
Platelets: 248 10*3/uL (ref 150–400)
RBC: 4 MIL/uL (ref 3.87–5.11)
RDW: 13.1 % (ref 11.5–15.5)
WBC: 5.5 10*3/uL (ref 4.0–10.5)

## 2013-09-30 LAB — BASIC METABOLIC PANEL
BUN: 18 mg/dL (ref 6–23)
CALCIUM: 8.9 mg/dL (ref 8.4–10.5)
CO2: 25 meq/L (ref 19–32)
Chloride: 103 mEq/L (ref 96–112)
Creatinine, Ser: 1.06 mg/dL (ref 0.50–1.10)
GFR calc Af Amer: 54 mL/min — ABNORMAL LOW (ref >90)
GFR calc non Af Amer: 47 mL/min — ABNORMAL LOW (ref >90)
GLUCOSE: 86 mg/dL (ref 70–99)
POTASSIUM: 4.3 meq/L (ref 3.7–5.3)
Sodium: 140 mEq/L (ref 137–147)

## 2013-09-30 LAB — URINALYSIS, ROUTINE W REFLEX MICROSCOPIC
Bilirubin Urine: NEGATIVE
GLUCOSE, UA: NEGATIVE mg/dL
HGB URINE DIPSTICK: NEGATIVE
Ketones, ur: NEGATIVE mg/dL
Leukocytes, UA: NEGATIVE
Nitrite: POSITIVE — AB
PROTEIN: NEGATIVE mg/dL
Specific Gravity, Urine: 1.017 (ref 1.005–1.030)
Urobilinogen, UA: 1 mg/dL (ref 0.0–1.0)
pH: 7 (ref 5.0–8.0)

## 2013-09-30 LAB — URINE MICROSCOPIC-ADD ON

## 2013-09-30 MED ORDER — SULFAMETHOXAZOLE-TMP DS 800-160 MG PO TABS: 1.0000 | ORAL_TABLET | Freq: Two times a day (BID) | ORAL | Status: DC

## 2013-09-30 NOTE — ED Notes (Addendum)
[facility] unwitnessed [facility], found laying on floor. Patient has laceration to upper Left eyebrow. C/O lower back pain on palpation. Patient passed spinal cord exam by EMS. Patient with hx of dementia, is at baseline. Patient does not remember falling.

## 2013-09-30 NOTE — ED Notes (Signed)
Patient arrives with C collar, KED device. Small abrasion to left eyebrow, bleeding controlled. Patient able to move all extremities without difficulty. Patient states she remembers standing and nothing else. Patient repeatedly asking where she is. Patient oriented to location and call bell. Call bell in reach, side rails up x2.

## 2013-09-30 NOTE — ED Notes (Signed)
Bed: [id number] Expected date: 09/30/13 Expected time: 11:04 AM Means of arrival: Ambulance Comments: fall

## 2013-09-30 NOTE — ED Provider Notes (Signed)
CSN: [id number]     Arrival date & time 09/30/13  1115 History   First MD Initiated Contact with Patient 09/30/13 1154     Chief Complaint  Patient presents with  . Fall  . Head Laceration  . Back Pain   Level V caveat due to dementia.  (Consider location/radiation/quality/duration/timing/severity/associated sxs/prior Treatment) Patient is a 80 y.o. female presenting with fall, scalp laceration, and back pain. The history is provided by the patient.  Fall This is a recurrent problem.  Head Laceration  Back Pain  patient with a history of dementia. Reportedly has had some confusion and was found on the floor. Has a history of frequent falls. She has some lower back pain.  Past Medical History  Diagnosis Date  . Hypertension   . Alzheimer's dementia   . Pneumonia   . Gout   . Tachycardia   . Chronic renal insufficiency   . Hernia    Past Surgical History  Procedure Laterality Date  . Cholecystectomy    . Abdominal hysterectomy    . Colon surgery    . Stomach surgery     No family history on file. History  Substance Use Topics  . Smoking status: Never Smoker   . Smokeless tobacco: Never Used  . Alcohol Use: No   OB History   Grav Para Term Preterm Abortions TAB SAB Ect Mult Living            3     Review of Systems  Unable to perform ROS Musculoskeletal: Positive for back pain. Negative for neck stiffness.  Skin: Positive for wound.      Allergies  Cephalosporins; Macrolides and ketolides; and Penicillins  Home Medications   Current Outpatient Rx  Name  Route  Sig  Dispense  Refill  . acidophilus (RISAQUAD) CAPS capsule   Oral   Take 1 capsule by mouth daily. For 20 days.         Marland Kitchen. amLODipine (NORVASC) 5 MG tablet   Oral   Take 5 mg by mouth every morning.          . Artificial Tear Ointment (REFRESH LACRI-LUBE) OINT   Both Eyes   Place 1 application into both eyes 3 (three) times daily.          . calcium carbonate (OS-CAL) 600 MG TABS    Oral   Take 600 mg by mouth every morning.          . Cholecalciferol (VITAMIN D3) 5000 UNITS TABS   Oral   Take 1 tablet by mouth every morning.         . Cranberry 450 MG CAPS   Oral   Take 1 capsule by mouth 2 (two) times daily.         . divalproex (DEPAKOTE SPRINKLE) 125 MG capsule   Oral   Take 250 mg by mouth 4 (four) times daily.         Marland Kitchen. FLUoxetine (PROZAC) 10 MG capsule   Oral   Take 10 mg by mouth daily. Take with 1 capsule orally every morning with 20 mg to equal  30 mg.         . FLUoxetine (PROZAC) 20 MG capsule   Oral   Take 20 mg by mouth every morning. Take 1 capsule every morning with 10 mg to equal 30 mg.         . Lactobacillus (ACIDOPHILUS) TABS   Oral   Take 1 tablet by mouth every morning.         .Marland Kitchen  mirtazapine (REMERON) 30 MG tablet   Oral   Take 30 mg by mouth at bedtime.         Marland Kitchen OLANZapine (ZYPREXA) 10 MG tablet   Oral   Take 5 mg by mouth 2 (two) times daily.         Marland Kitchen omeprazole (PRILOSEC) 20 MG capsule   Oral   Take 20 mg by mouth 2 (two) times daily. At 0800 an 1600         . potassium chloride SA (K-DUR,KLOR-CON) 20 MEQ tablet   Oral   Take 20 mEq by mouth every morning.          Marland Kitchen PRESCRIPTION MEDICATION   Topical   Apply 0.5 mg topically every 6 (six) hours as needed (agitation.). Lorazepam 1 mg/ml topical gel.         . rivastigmine (EXELON) 9.5 mg/24hr   Transdermal   Place 9.5 mg onto the skin daily.         . vitamin B-12 (CYANOCOBALAMIN) 1000 MCG tablet   Oral   Take 1,000 mcg by mouth every morning.          Marland Kitchen acetaminophen (TYLENOL) 500 MG tablet   Oral   Take 500 mg by mouth every 4 (four) hours as needed for pain or fever.          Marland Kitchen alum & mag hydroxide-simeth (MAALOX/MYLANTA) 200-200-20 MG/5ML suspension   Oral   Take 30 mLs by mouth every 6 (six) hours as needed for indigestion.         Marland Kitchen guaiFENesin (ROBITUSSIN) 100 MG/5ML liquid   Oral   Take 200 mg by mouth every 6  (six) hours as needed for cough. Do not exceed 4 doses         . loperamide (IMODIUM A-D) 2 MG tablet   Oral   Take 2 mg by mouth 4 (four) times daily as needed for diarrhea or loose stools.         . magnesium hydroxide (MILK OF MAGNESIA) 400 MG/5ML suspension   Oral   Take 30 mLs by mouth at bedtime as needed for constipation.          . sulfamethoxazole-trimethoprim (BACTRIM DS) 800-160 MG per tablet   Oral   Take 1 tablet by mouth 2 (two) times daily.   14 tablet   0    BP 144/26  Pulse 81  Temp(Src) 97.5 F (36.4 C) (Oral)  Resp 16  Wt 190 lb (86.183 kg)  SpO2 98% Physical Exam  Constitutional: She appears well-developed.  HENT:  Head: Normocephalic.  Small laceration in lateral left eyebrow. No bony deformity  Eyes: Pupils are equal, round, and reactive to light.  Cardiovascular: Normal rate and regular rhythm.   Pulmonary/Chest: Effort normal and breath sounds normal.  Abdominal: There is no tenderness.  Musculoskeletal: Normal range of motion. She exhibits no tenderness.  Tenderness over lumbar spine. No step-off or deformity.  Neurological: She is alert.  Patient at slightly greater than baseline confusion per daughter  Skin: Skin is warm.    ED Course  Procedures (including critical care time) Labs Review Labs Reviewed  URINALYSIS, ROUTINE W REFLEX MICROSCOPIC - Abnormal; Notable for the following:    Nitrite POSITIVE (*)    All other components within normal limits  BASIC METABOLIC PANEL - Abnormal; Notable for the following:    GFR calc non Af Amer 47 (*)    GFR calc Af Amer 54 (*)  All other components within normal limits  URINE MICROSCOPIC-ADD ON - Abnormal; Notable for the following:    Bacteria, UA MANY (*)    All other components within normal limits  URINE CULTURE  CBC WITH DIFFERENTIAL   Imaging Review Dg Chest 2 View  09/30/2013   CLINICAL DATA:  ams  EXAM: CHEST  2 VIEW  COMPARISON:  DG CHEST 1 VIEW dated 08/17/2013  FINDINGS: Low  lung volumes. Cardiac silhouettes unremarkable. The aorta is tortuous and ectatic. Atherosclerotic calcifications appreciated in the arch of the aorta. The osseous structures are unremarkable.  IMPRESSION: No active cardiopulmonary disease.   Electronically Signed   By: Salome Holmes M.D.   On: 09/30/2013 13:00   Dg Lumbar Spine Complete  09/30/2013   CLINICAL DATA:  Post fall with back pain.  EXAM: LUMBAR SPINE - COMPLETE 4+ VIEW  COMPARISON:  08/17/2013  FINDINGS: Examination demonstrates mild spondylosis. Vertebral body heights are maintained. There is disc space narrowing from the L3-4 level to the L5-S1 level unchanged. Facet arthropathy is present over the lower lumbar spine. There is a subtle grade 1 anterolisthesis of L4 on L5 unchanged likely due to facet arthropathy. There is calcified plaque over the abdominal aorta and iliac vessels.  IMPRESSION: No acute findings.  Mild spondylosis. Disc disease from the L3-4 level to the L5-S1 level unchanged. Stable subtle grade 1 anterolisthesis of L4 on L5 likely due to facet arthropathy.   Electronically Signed   By: Elberta Fortis M.D.   On: 09/30/2013 13:02   Ct Head Wo Contrast  09/30/2013   CLINICAL DATA:  Fall with trauma to left periorbital region.  EXAM: CT HEAD WITHOUT CONTRAST  CT CERVICAL SPINE WITHOUT CONTRAST  TECHNIQUE: Multidetector CT imaging of the head and cervical spine was performed following the standard protocol without intravenous contrast. Multiplanar CT image reconstructions of the cervical spine were also generated.  COMPARISON:  08/17/2013 and 01/24/2013.  FINDINGS: CT HEAD FINDINGS  Ventricles and cisterns are within normal. There is minimal age related atrophic change and chronic ischemic microvascular disease which is stable. There is no mass, mass effect, shift of midline structures or acute hemorrhage. No evidence of acute infarction. Minimal left periorbital soft tissue swelling. No evidence of fracture.  CT CERVICAL SPINE  FINDINGS  The vertebral body alignment and heights are within normal. There is mild spondylosis throughout the cervical spine. There is moderate disc space narrowing at the C5-6 and C6-7 levels. Prevertebral soft tissues are within normal. There is uncovertebral joint spurring and facet arthropathy. Bilateral neural foraminal narrowing is present at the C5-6 level right greater than left. The C1-2 articulation is within normal. There is no acute fracture or subluxation.  There is heterogeneity of the thyroid gland with a 2.7 cm nodule over the right lobe unchanged. Remainder the exam is unchanged.  IMPRESSION: No acute intracranial findings. Minimal left periorbital soft tissue swelling. No fracture.  No acute cervical spine injury.  Chronic ischemic microvascular disease and mild age related atrophy.  Spondylosis of the cervical spine with moderate disc disease at the C5-6 and C6-7 levels. Bilateral neural foraminal narrowing at the C5-6 level right worse than left.  Heterogeneous nodular thyroid gland with no change in a 2.7 cm nodule over the right lobe. Consider ultrasound for further evaluation.   Electronically Signed   By: Elberta Fortis M.D.   On: 09/30/2013 12:50   Ct Cervical Spine Wo Contrast  09/30/2013   CLINICAL DATA:  Fall with trauma to  left periorbital region.  EXAM: CT HEAD WITHOUT CONTRAST  CT CERVICAL SPINE WITHOUT CONTRAST  TECHNIQUE: Multidetector CT imaging of the head and cervical spine was performed following the standard protocol without intravenous contrast. Multiplanar CT image reconstructions of the cervical spine were also generated.  COMPARISON:  08/17/2013 and 01/24/2013.  FINDINGS: CT HEAD FINDINGS  Ventricles and cisterns are within normal. There is minimal age related atrophic change and chronic ischemic microvascular disease which is stable. There is no mass, mass effect, shift of midline structures or acute hemorrhage. No evidence of acute infarction. Minimal left periorbital  soft tissue swelling. No evidence of fracture.  CT CERVICAL SPINE FINDINGS  The vertebral body alignment and heights are within normal. There is mild spondylosis throughout the cervical spine. There is moderate disc space narrowing at the C5-6 and C6-7 levels. Prevertebral soft tissues are within normal. There is uncovertebral joint spurring and facet arthropathy. Bilateral neural foraminal narrowing is present at the C5-6 level right greater than left. The C1-2 articulation is within normal. There is no acute fracture or subluxation.  There is heterogeneity of the thyroid gland with a 2.7 cm nodule over the right lobe unchanged. Remainder the exam is unchanged.  IMPRESSION: No acute intracranial findings. Minimal left periorbital soft tissue swelling. No fracture.  No acute cervical spine injury.  Chronic ischemic microvascular disease and mild age related atrophy.  Spondylosis of the cervical spine with moderate disc disease at the C5-6 and C6-7 levels. Bilateral neural foraminal narrowing at the C5-6 level right worse than left.  Heterogeneous nodular thyroid gland with no change in a 2.7 cm nodule over the right lobe. Consider ultrasound for further evaluation.   Electronically Signed   By: Elberta Fortis M.D.   On: 09/30/2013 12:50    EKG Interpretation   None       MDM   Final diagnoses:  Fall  Facial laceration  UTI (urinary tract infection)   LACERATION REPAIR Performed by: Billee Cashing. Authorized by: Billee Cashing Consent: Verbal consent obtained. Risks and benefits: risks, benefits and alternatives were discussed Consent given by: patient Patient identity confirmed: provided demographic data Prepped and Draped in normal sterile fashion Wound explored  Laceration Location: left eyebrow  Laceration Length: 1.5cm  No Foreign Bodies seen or palpated  Anesthesia: local infiltration  Local anesthetic: lidocaine 2%  Anesthetic total: 1 ml  Irrigation method:  syringe Amount of cleaning: standard  Skin closure: 4-0 vicryl rapide  Number of sutures: 3  Technique:  Simple interrupted  Patient tolerance: Patient tolerated the procedure well with no immediate complications. Patient with fall. Laceration over eyebrow. Closed with sutures. Negative imaging. Patient has had mildly increased confusion. Possible UTI. PCP will follow culture. We'll start antibiotics    Juliet Rude. Rubin Payor, MD 09/30/13 [phone]

## 2013-09-30 NOTE — ED Notes (Signed)
Patient daughter at bedside, states facility reported to her patient has been increasingly agitated over the last 24 hours. Patient  Daughter requesting UA to check for UTI.

## 2013-09-30 NOTE — Discharge Instructions (Signed)
Facial Laceration ° A facial laceration is a cut on the face. These injuries can be painful and cause bleeding. Lacerations usually heal quickly, but they need special care to reduce scarring. °DIAGNOSIS  °Your health care provider will take a medical history, ask for details about how the injury occurred, and examine the wound to determine how deep the cut is. °TREATMENT  °Some facial lacerations may not require closure. Others may not be able to be closed because of an increased risk of infection. The risk of infection and the chance for successful closure will depend on various factors, including the amount of time since the injury occurred. °The wound may be cleaned to help prevent infection. If closure is appropriate, pain medicines may be given if needed. Your health care provider will use stitches (sutures), wound glue (adhesive), or skin adhesive strips to repair the laceration. These tools bring the skin edges together to allow for faster healing and a better cosmetic outcome. If needed, you may also be given a tetanus shot. °HOME CARE INSTRUCTIONS °· Only take over-the-counter or prescription medicines as directed by your health care provider. °· Follow your health care provider's instructions for wound care. These instructions will vary depending on the technique used for closing the wound. °For Sutures: °· Keep the wound clean and dry.   °· If you were given a bandage (dressing), you should change it at least once a day. Also change the dressing if it becomes wet or dirty, or as directed by your health care provider.   °· Wash the wound with soap and water 2 times a day. Rinse the wound off with water to remove all soap. Pat the wound dry with a clean towel.   °· After cleaning, apply a thin layer of the antibiotic ointment recommended by your health care provider. This will help prevent infection and keep the dressing from sticking.   °· You may shower as usual after the first 24 hours. Do not soak the  wound in water until the sutures are removed.   °· Get your sutures removed as directed by your health care provider. With facial lacerations, sutures should usually be taken out after 4 5 days to avoid stitch marks.   °· Wait a few days after your sutures are removed before applying any makeup. °For Skin Adhesive Strips: °· Keep the wound clean and dry.   °· Do not get the skin adhesive strips wet. You may bathe carefully, using caution to keep the wound dry.   °· If the wound gets wet, pat it dry with a clean towel.   °· Skin adhesive strips will fall off on their own. You may trim the strips as the wound heals. Do not remove skin adhesive strips that are still stuck to the wound. They will fall off in time.   °For Wound Adhesive: °· You may briefly wet your wound in the shower or bath. Do not soak or scrub the wound. Do not swim. Avoid periods of heavy sweating until the skin adhesive has fallen off on its own. After showering or bathing, gently pat the wound dry with a clean towel.   °· Do not apply liquid medicine, cream medicine, ointment medicine, or makeup to your wound while the skin adhesive is in place. This may loosen the film before your wound is healed.   °· If a dressing is placed over the wound, be careful not to apply tape directly over the skin adhesive. This may cause the adhesive to be pulled off before the wound is healed.   °·   Avoid prolonged exposure to sunlight or tanning lamps while the skin adhesive is in place.  The skin adhesive will usually remain in place for 5 10 days, then naturally fall off the skin. Do not pick at the adhesive film.  After Healing: Once the wound has healed, cover the wound with sunscreen during the day for 1 full year. This can help minimize scarring. Exposure to ultraviolet light in the first year will darken the scar. It can take 1 2 years for the scar to lose its redness and to heal completely.  SEEK IMMEDIATE MEDICAL CARE IF:  You have redness, pain, or  swelling around the wound.   You see ayellowish-white fluid (pus) coming from the wound.   You have chills or a fever.  MAKE SURE YOU:  Understand these instructions.  Will watch your condition.  Will get help right away if you are not doing well or get worse. Document Released: 09/09/2004 Document Revised: 05/23/2013 Document Reviewed: 03/15/2013 [facility] Patient Information 2014 [place].  Urinary Tract Infection Urinary tract infections (UTIs) can develop anywhere along your urinary tract. Your urinary tract is your body's drainage system for removing wastes and extra water. Your urinary tract includes two kidneys, two ureters, a bladder, and a urethra. Your kidneys are a pair of bean-shaped organs. Each kidney is about the size of your fist. They are located below your ribs, one on each side of your spine. CAUSES Infections are caused by microbes, which are microscopic organisms, including fungi, viruses, and bacteria. These organisms are so small that they can only be seen through a microscope. Bacteria are the microbes that most commonly cause UTIs. SYMPTOMS  Symptoms of UTIs may vary by age and gender of the patient and by the location of the infection. Symptoms in young women typically include a frequent and intense urge to urinate and a painful, burning feeling in the bladder or urethra during urination. Older women and men are more likely to be tired, shaky, and weak and have muscle aches and abdominal pain. A fever may mean the infection is in your kidneys. Other symptoms of a kidney infection include pain in your back or sides below the ribs, nausea, and vomiting. DIAGNOSIS To diagnose a UTI, your caregiver will ask you about your symptoms. Your caregiver also will ask to provide a urine sample. The urine sample will be tested for bacteria and white blood cells. White blood cells are made by your body to help fight infection. TREATMENT  Typically, UTIs can be treated  with medication. Because most UTIs are caused by a bacterial infection, they usually can be treated with the use of antibiotics. The choice of antibiotic and length of treatment depend on your symptoms and the type of bacteria causing your infection. HOME CARE INSTRUCTIONS  If you were prescribed antibiotics, take them exactly as your caregiver instructs you. Finish the medication even if you feel better after you have only taken some of the medication.  Drink enough water and fluids to keep your urine clear or pale yellow.  Avoid caffeine, tea, and carbonated beverages. They tend to irritate your bladder.  Empty your bladder often. Avoid holding urine for long periods of time.  Empty your bladder before and after sexual intercourse.  After a bowel movement, women should cleanse from front to back. Use each tissue only once. SEEK MEDICAL CARE IF:   You have back pain.  You develop a fever.  Your symptoms do not begin to resolve within 3  days. SEEK IMMEDIATE MEDICAL CARE IF:   You have severe back pain or lower abdominal pain.  You develop chills.  You have nausea or vomiting.  You have continued burning or discomfort with urination. MAKE SURE YOU:   Understand these instructions.  Will watch your condition.  Will get help right away if you are not doing well or get worse. Document Released: 05/12/2005 Document Revised: 02/01/2012 Document Reviewed: 09/10/2011 [facility] Patient Information 2014 [place], [place].

## 2013-09-30 NOTE — ED Notes (Signed)
Dr Pickering at bedside 

## 2013-10-02 LAB — URINE CULTURE

## 2013-12-20 ENCOUNTER — Emergency Department (HOSPITAL_COMMUNITY): Payer: Medicare Other

## 2013-12-20 ENCOUNTER — Encounter (HOSPITAL_COMMUNITY): Payer: Self-pay | Admitting: Emergency Medicine

## 2013-12-20 ENCOUNTER — Emergency Department (HOSPITAL_COMMUNITY)
Admission: EM | Admit: 2013-12-20 | Discharge: 2013-12-20 | Disposition: A | Discharge status: Home / Self Care | Payer: Medicare Other | Attending: Emergency Medicine | Admitting: Emergency Medicine

## 2013-12-20 DIAGNOSIS — Z880 Allergy status to penicillin: Secondary | ICD-10-CM | POA: Insufficient documentation

## 2013-12-20 DIAGNOSIS — IMO0002 Reserved for concepts with insufficient information to code with codable children: Secondary | ICD-10-CM | POA: Insufficient documentation

## 2013-12-20 DIAGNOSIS — Y9210 Unspecified residential institution as the place of occurrence of the external cause: Secondary | ICD-10-CM | POA: Insufficient documentation

## 2013-12-20 DIAGNOSIS — F0280 Dementia in other diseases classified elsewhere without behavioral disturbance: Secondary | ICD-10-CM | POA: Insufficient documentation

## 2013-12-20 DIAGNOSIS — Z862 Personal history of diseases of the blood and blood-forming organs and certain disorders involving the immune mechanism: Secondary | ICD-10-CM | POA: Insufficient documentation

## 2013-12-20 DIAGNOSIS — K439 Ventral hernia without obstruction or gangrene: Secondary | ICD-10-CM | POA: Insufficient documentation

## 2013-12-20 DIAGNOSIS — Y939 Activity, unspecified: Secondary | ICD-10-CM | POA: Insufficient documentation

## 2013-12-20 DIAGNOSIS — Z79899 Other long term (current) drug therapy: Secondary | ICD-10-CM | POA: Insufficient documentation

## 2013-12-20 DIAGNOSIS — G309 Alzheimer's disease, unspecified: Secondary | ICD-10-CM | POA: Insufficient documentation

## 2013-12-20 DIAGNOSIS — Z23 Encounter for immunization: Secondary | ICD-10-CM | POA: Insufficient documentation

## 2013-12-20 DIAGNOSIS — I129 Hypertensive chronic kidney disease with stage 1 through stage 4 chronic kidney disease, or unspecified chronic kidney disease: Secondary | ICD-10-CM | POA: Insufficient documentation

## 2013-12-20 DIAGNOSIS — Z8701 Personal history of pneumonia (recurrent): Secondary | ICD-10-CM | POA: Insufficient documentation

## 2013-12-20 DIAGNOSIS — S0180XA Unspecified open wound of other part of head, initial encounter: Secondary | ICD-10-CM | POA: Insufficient documentation

## 2013-12-20 DIAGNOSIS — W19XXXA Unspecified fall, initial encounter: Secondary | ICD-10-CM | POA: Insufficient documentation

## 2013-12-20 DIAGNOSIS — Z8639 Personal history of other endocrine, nutritional and metabolic disease: Secondary | ICD-10-CM | POA: Insufficient documentation

## 2013-12-20 DIAGNOSIS — T148XXA Other injury of unspecified body region, initial encounter: Secondary | ICD-10-CM

## 2013-12-20 DIAGNOSIS — N390 Urinary tract infection, site not specified: Secondary | ICD-10-CM

## 2013-12-20 DIAGNOSIS — S0181XA Laceration without foreign body of other part of head, initial encounter: Secondary | ICD-10-CM

## 2013-12-20 DIAGNOSIS — N189 Chronic kidney disease, unspecified: Secondary | ICD-10-CM | POA: Insufficient documentation

## 2013-12-20 LAB — BASIC METABOLIC PANEL
BUN: 22 mg/dL (ref 6–23)
CALCIUM: 9.4 mg/dL (ref 8.4–10.5)
CO2: 20 mEq/L (ref 19–32)
CREATININE: 1.07 mg/dL (ref 0.50–1.10)
Chloride: 109 mEq/L (ref 96–112)
GFR calc Af Amer: 53 mL/min — ABNORMAL LOW (ref >90)
GFR calc non Af Amer: 46 mL/min — ABNORMAL LOW (ref >90)
Glucose, Bld: 119 mg/dL — ABNORMAL HIGH (ref 70–99)
Potassium: 4.8 mEq/L (ref 3.7–5.3)
Sodium: 145 mEq/L (ref 137–147)

## 2013-12-20 LAB — CBC WITH DIFFERENTIAL/PLATELET
Basophils Absolute: 0.1 10*3/uL (ref 0.0–0.1)
Basophils Relative: 1 % (ref 0–1)
EOS ABS: 0.1 10*3/uL (ref 0.0–0.7)
EOS PCT: 1 % (ref 0–5)
HEMATOCRIT: 41.4 % (ref 36.0–46.0)
HEMOGLOBIN: 13.5 g/dL (ref 12.0–15.0)
Lymphocytes Relative: 16 % (ref 12–46)
Lymphs Abs: 1.2 10*3/uL (ref 0.7–4.0)
MCH: 31.2 pg (ref 26.0–34.0)
MCHC: 32.6 g/dL (ref 30.0–36.0)
MCV: 95.6 fL (ref 78.0–100.0)
MONOS PCT: 7 % (ref 3–12)
Monocytes Absolute: 0.6 10*3/uL (ref 0.1–1.0)
Neutro Abs: 5.8 10*3/uL (ref 1.7–7.7)
Neutrophils Relative %: 75 % (ref 43–77)
Platelets: 261 10*3/uL (ref 150–400)
RBC: 4.33 MIL/uL (ref 3.87–5.11)
RDW: 13.6 % (ref 11.5–15.5)
WBC: 7.7 10*3/uL (ref 4.0–10.5)

## 2013-12-20 LAB — URINALYSIS, ROUTINE W REFLEX MICROSCOPIC
Glucose, UA: NEGATIVE mg/dL
Hgb urine dipstick: NEGATIVE
KETONES UR: 15 mg/dL — AB
Nitrite: POSITIVE — AB
PROTEIN: NEGATIVE mg/dL
SPECIFIC GRAVITY, URINE: 1.023 (ref 1.005–1.030)
UROBILINOGEN UA: 4 mg/dL — AB (ref 0.0–1.0)
pH: 8 (ref 5.0–8.0)

## 2013-12-20 LAB — URINE MICROSCOPIC-ADD ON

## 2013-12-20 LAB — GRAM STAIN: Special Requests: NORMAL

## 2013-12-20 LAB — TROPONIN I: Troponin I: <0.3 ng/mL (ref <0.30)

## 2013-12-20 MED ORDER — SULFAMETHOXAZOLE-TMP DS 800-160 MG PO TABS: 1.0000 | ORAL_TABLET | Freq: Two times a day (BID) | ORAL | Status: DC

## 2013-12-20 MED ORDER — TETANUS-DIPHTHERIA TOXOIDS TD 5-2 LFU IM INJ
0.5000 mL | INJECTION | Freq: Once | INTRAMUSCULAR | Status: AC
  Administered 2013-12-20: 0.5 mL via INTRAMUSCULAR
  Filled 2013-12-20: qty 0.5

## 2013-12-20 NOTE — ED Notes (Signed)
Per EMS[facility] Patient has a History of Alzheimer's and Dementia. Caregivers came to check on her at around 1500 and patient was found face down on the ground. Unknown when the patient was last seen normal. Patient was checked on earlier in the morning and was resting in the bed. Patient has skin noted on forehea, bilateral elbows, and one on the left lower leg. Midline C-Spine Pain noted. Vitals per EMS: 130/98, 110 HR, 20 RR.

## 2013-12-20 NOTE — ED Notes (Signed)
MD at the bedside completing Dermabond.

## 2013-12-20 NOTE — Discharge Instructions (Signed)
Abrasion °An abrasion is a cut or scrape of the skin. Abrasions do not extend through all layers of the skin and most heal within 10 days. It is important to care for your abrasion properly to prevent infection. °CAUSES  °Most abrasions are caused by falling on, or gliding across, the ground or other surface. When your skin rubs on something, the outer and inner layer of skin rubs off, causing an abrasion. °DIAGNOSIS  °Your caregiver will be able to diagnose an abrasion during a physical exam.  °TREATMENT  °Your treatment depends on how large and deep the abrasion is. Generally, your abrasion will be cleaned with water and a mild soap to remove any dirt or debris. An antibiotic ointment may be put over the abrasion to prevent an infection. A bandage (dressing) may be wrapped around the abrasion to keep it from getting dirty.  °You may need a tetanus shot if: °· You cannot remember when you had your last tetanus shot. °· You have never had a tetanus shot. °· The injury broke your skin. °If you get a tetanus shot, your arm may swell, get red, and feel warm to the touch. This is common and not a problem. If you need a tetanus shot and you choose not to have one, there is a rare chance of getting tetanus. Sickness from tetanus can be serious.  °HOME CARE INSTRUCTIONS  °· If a dressing was applied, change it at least once a day or as directed by your caregiver. If the bandage sticks, soak it off with warm water.   °· Wash the area with water and a mild soap to remove all the ointment 2 times a day. Rinse off the soap and pat the area dry with a clean towel.   °· Reapply any ointment as directed by your caregiver. This will help prevent infection and keep the bandage from sticking. Use gauze over the wound and under the dressing to help keep the bandage from sticking.   °· Change your dressing right away if it becomes wet or dirty.   °· Only take over-the-counter or prescription medicines for pain, discomfort, or fever as  directed by your caregiver.   °· Follow up with your caregiver within 24 48 hours for a wound check, or as directed. If you were not given a wound-check appointment, look closely at your abrasion for redness, swelling, or pus. These are signs of infection. °SEEK IMMEDIATE MEDICAL CARE IF:  °· You have increasing pain in the wound.   °· You have redness, swelling, or tenderness around the wound.   °· You have pus coming from the wound.   °· You have a fever or persistent symptoms for more than 2 3 days. °· You have a fever and your symptoms suddenly get worse. °· You have a bad smell coming from the wound or dressing.   °MAKE SURE YOU:  °· Understand these instructions. °· Will watch your condition. °· Will get help right away if you are not doing well or get worse. °Document Released: 05/12/2005 Document Revised: 07/19/2012 Document Reviewed: 07/06/2011 °[employer]® Patient Information ©2014 [employer]. ° °

## 2013-12-20 NOTE — ED Provider Notes (Signed)
CSN: [id number]     Arrival date & time 12/20/13  1540 History   First MD Initiated Contact with Patient 12/20/13 1553     Chief Complaint  Patient presents with  . Fall     (Consider location/radiation/quality/duration/timing/severity/associated sxs/prior Treatment) HPI Comments: [facility]  Last seen this morning at breakfast.  Patient has no complaints and does not remember falling down or passing out.  Per NH, staff heard patient calling out for help and when they went into room they found patient was face down on the ground.  They noticed skin tears and sent patient to ED for evaluation.  Per NH, patient normally oriented to person and there is concern for UTI because patient has been weak for the past couple of days which has occurred in the past with UTI.    Patient is a 80 y.o. female presenting with fall. The history is provided by the patient, medical records and the EMS personnel.  Fall This is a new problem. The current episode started today. The problem occurs rarely. The problem has been unchanged. Pertinent negatives include no abdominal pain, chest pain, fever, sore throat, visual change, vomiting or weakness. Nothing aggravates the symptoms. She has tried immobilization for the symptoms. The treatment provided no relief.    Past Medical History  Diagnosis Date  . Hypertension   . Alzheimer's dementia   . Pneumonia   . Gout   . Tachycardia   . Chronic renal insufficiency   . Hernia    Past Surgical History  Procedure Laterality Date  . Cholecystectomy    . Abdominal hysterectomy    . Colon surgery    . Stomach surgery     No family history on file. History  Substance Use Topics  . Smoking status: Never Smoker   . Smokeless tobacco: Never Used  . Alcohol Use: No   OB History   Grav Para Term Preterm Abortions TAB SAB Ect Mult Living            3     Review of Systems  Constitutional: Negative for fever.  HENT:  Negative for sore throat.   Cardiovascular: Negative for chest pain.  Gastrointestinal: Negative for vomiting and abdominal pain.  Neurological: Negative for weakness.  All other systems reviewed and are negative.     Allergies  Cephalosporins; Macrolides and ketolides; and Penicillins  Home Medications   Prior to Admission medications   Medication Sig Start Date End Date Taking? Authorizing Provider  acetaminophen (TYLENOL) 500 MG tablet Take 500 mg by mouth every 4 (four) hours as needed for pain or fever.     Historical Provider, MD  acidophilus (RISAQUAD) CAPS capsule Take 1 capsule by mouth daily. For 20 days. 08/06/13   Historical Provider, MD  amLODipine (NORVASC) 5 MG tablet Take 5 mg by mouth every morning.     Historical Provider, MD  Artificial Tear Ointment (REFRESH LACRI-LUBE) OINT Place 1 application into both eyes 3 (three) times daily.     Historical Provider, MD  calcium carbonate (OS-CAL) 600 MG TABS Take 600 mg by mouth every morning.     Historical Provider, MD  Cholecalciferol (VITAMIN D3) 5000 UNITS TABS Take 1 tablet by mouth every morning.    Historical Provider, MD  Cranberry 450 MG CAPS Take 1 capsule by mouth 2 (two) times daily.    Historical Provider, MD  divalproex (DEPAKOTE SPRINKLE) 125 MG capsule Take 250 mg by  mouth 4 (four) times daily.    Historical Provider, MD  FLUoxetine (PROZAC) 10 MG capsule Take 10 mg by mouth daily. Take with 1 capsule orally every morning with 20 mg to equal  30 mg.    Historical Provider, MD  FLUoxetine (PROZAC) 20 MG capsule Take 20 mg by mouth every morning. Take 1 capsule every morning with 10 mg to equal 30 mg.    Historical Provider, MD  Lactobacillus (ACIDOPHILUS) TABS Take 1 tablet by mouth every morning.    Historical Provider, MD  loperamide (IMODIUM A-D) 2 MG tablet Take 2 mg by mouth 4 (four) times daily as needed for diarrhea or loose stools.    Historical Provider, MD  magnesium hydroxide (MILK OF MAGNESIA) 400  MG/5ML suspension Take 30 mLs by mouth at bedtime as needed for constipation.     Historical Provider, MD  mirtazapine (REMERON) 30 MG tablet Take 30 mg by mouth at bedtime.    Historical Provider, MD  OLANZapine (ZYPREXA) 10 MG tablet Take 5 mg by mouth 2 (two) times daily.    Historical Provider, MD  omeprazole (PRILOSEC) 20 MG capsule Take 20 mg by mouth 2 (two) times daily. At 0800 an 1600    Historical Provider, MD  potassium chloride SA (K-DUR,KLOR-CON) 20 MEQ tablet Take 20 mEq by mouth every morning.     Historical Provider, MD  rivastigmine (EXELON) 9.5 mg/24hr Place 9.5 mg onto the skin daily.    Historical Provider, MD  vitamin B-12 (CYANOCOBALAMIN) 1000 MCG tablet Take 1,000 mcg by mouth every morning.     Historical Provider, MD   BP 126/61  Pulse 95  Temp(Src) 98.4 F (36.9 C)  Resp 40  SpO2 100% Physical Exam  Constitutional: She appears well-developed and well-nourished.  HENT:  Head: Normocephalic.  Laceration to left forehead appx 1 cm and dermis is violated; no FB or bleeding noted.    Eyes: Pupils are equal, round, and reactive to light.  Neck:  Mild midline TTP  Cardiovascular: Normal rate and regular rhythm.   Pulmonary/Chest: Effort normal and breath sounds normal. No respiratory distress. She exhibits no tenderness.  Abdominal: Soft. Bowel sounds are normal. She exhibits no distension and no mass. There is no tenderness.  Ventral hernia that is easily reducible  Musculoskeletal: Normal range of motion. She exhibits no edema and no tenderness.  Abrasions to bilateral forearms and right anterior shin - no TTP.    Neurological: She is alert.  Skin: Skin is warm and dry.  Psychiatric: She has a normal mood and affect.    ED Course  LACERATION REPAIR Date/Time: 12/20/2013 9:00 PM Performed by: Johnney Ou Authorized by: Lyanne Co Consent: Verbal consent obtained. Risks and benefits: risks, benefits and alternatives were discussed Consent given by:  guardian and patient Patient identity confirmed: verbally with patient and arm band Time out: Immediately prior to procedure a "time out" was called to verify the correct patient, procedure, equipment, support staff and site/side marked as required. Body area: head/neck Location details: forehead Laceration length: 3 cm Foreign bodies: no foreign bodies Tendon involvement: none Nerve involvement: none Vascular damage: no Patient sedated: no Preparation: Patient was prepped and draped in the usual sterile fashion. Irrigation solution: saline Irrigation method: syringe Amount of cleaning: extensive Debridement: none Degree of undermining: none Skin closure: glue Technique: simple Approximation: close Approximation difficulty: simple Patient tolerance: Patient tolerated the procedure well with no immediate complications.   (including critical care time) Labs Review Labs Reviewed  BASIC METABOLIC PANEL - Abnormal; Notable for the following:    Glucose, Bld 119 (*)    GFR calc non Af Amer 46 (*)    GFR calc Af Amer 53 (*)    All other components within normal limits  URINALYSIS, ROUTINE W REFLEX MICROSCOPIC - Abnormal; Notable for the following:    APPearance CLOUDY (*)    Bilirubin Urine SMALL (*)    Ketones, ur 15 (*)    Urobilinogen, UA 4.0 (*)    Nitrite POSITIVE (*)    Leukocytes, UA LARGE (*)    All other components within normal limits  URINE MICROSCOPIC-ADD ON - Abnormal; Notable for the following:    Bacteria, UA MANY (*)    All other components within normal limits  GRAM STAIN  URINE CULTURE  CBC WITH DIFFERENTIAL  TROPONIN I    Imaging Review Dg Chest 1 View  12/20/2013   CLINICAL DATA:  Larey Seat today.  Unable to localized pain.  EXAM: CHEST - 1 VIEW  COMPARISON:  09/30/2013  FINDINGS: Heart size is normal. Lungs are hyperinflated. No focal consolidations or pleural effusions are identified. No pulmonary edema.  IMPRESSION: 1. Bronchitic changes. 2.  No focal acute  pulmonary abnormality.   Electronically Signed   By: Rosalie Gums M.D.   On: 12/20/2013 19:26   Dg Thoracic Spine 2 View  12/20/2013   CLINICAL DATA:  Larey Seat.  Back pain.  EXAM: THORACIC SPINE - 2 VIEW  COMPARISON:  None.  FINDINGS: Normal alignment of the thoracic vertebral bodies. Disc spaces and vertebral bodies are maintained. No acute compression fracture. No abnormal paraspinal soft tissue thickening. The visualized posterior ribs are intact.  IMPRESSION: Normal alignment and no acute bony findings.   Electronically Signed   By: Loralie Champagne M.D.   On: 12/20/2013 19:27   Dg Lumbar Spine Complete  12/20/2013   CLINICAL DATA:  Larey Seat.  Back pain.  EXAM: LUMBAR SPINE - COMPLETE 4+ VIEW  COMPARISON:  09/30/2013.  FINDINGS: Stable alignment of the lumbar vertebral bodies. Stable degenerative changes. No acute fracture. Stable aortoiliac calcifications without definite aneurysm. The visualized bony pelvis is grossly intact.  IMPRESSION: Stable alignment and no acute bony findings.   Electronically Signed   By: Loralie Champagne M.D.   On: 12/20/2013 19:27   Dg Forearm Left  12/20/2013   CLINICAL DATA:  Larey Seat.  Left forearm pain.  EXAM: LEFT FOREARM - 2 VIEW  COMPARISON:  None.  FINDINGS: The wrist and elbow joints are maintained. No acute forearm fracture.  IMPRESSION: No acute bony findings.   Electronically Signed   By: Loralie Champagne M.D.   On: 12/20/2013 19:25   Dg Forearm Right  12/20/2013   CLINICAL DATA:  Larey Seat.  Forearm pain.  EXAM: RIGHT FOREARM - 2 VIEW  COMPARISON:  None.  FINDINGS: The wrist and elbow joints are maintained. No acute forearm fracture.  IMPRESSION: No acute bony findings.   Electronically Signed   By: Loralie Champagne M.D.   On: 12/20/2013 19:25   Dg Tibia/fibula Right  12/20/2013   CLINICAL DATA:  Larey Seat.  Right leg pain.  EXAM: RIGHT TIBIA AND FIBULA - 2 VIEW  COMPARISON:  None.  FINDINGS: The knee and ankle joints are maintained. Advanced medial compartment degenerative changes noted  at the knee. Proximal patellar tendon calcifications are noted. No acute fracture.  IMPRESSION: No acute fracture.   Electronically Signed   By: Loralie Champagne M.D.   On: 12/20/2013 19:26  Ct Head Wo Contrast  12/20/2013   CLINICAL DATA:  80 year old patient with dementia. Found face down on the ground.  EXAM: CT HEAD WITHOUT CONTRAST  CT CERVICAL SPINE WITHOUT CONTRAST  TECHNIQUE: Multidetector CT imaging of the head and cervical spine was performed following the standard protocol without intravenous contrast. Multiplanar CT image reconstructions of the cervical spine were also generated.  COMPARISON:  09/30/2013 in 11/2017/2013, 01/24/2013.  FINDINGS: CT HEAD FINDINGS  There is a small defect in the skin surface of the left forehead superiorly. Additionally, there is a bandage and focal soft tissue swelling the level of the left supraorbital rim. The skull is intact. Multiple benign appearing rounded lucencies in the occipital skull are unchanged dating back to a least 2013. No evidence of skull fracture.  There is moderate chronic microvascular ischemic change in the periventricular white matter bilaterally. Negative for hemorrhage, hydrocephalus, mass effect, mass lesion, or evidence of acute cortically based infarction.  CT CERVICAL SPINE FINDINGS  The cervical spine is aligned from the skullbase through the T4-T5 junction. There is disc height narrowing at C5-6 and C6-7. Negative for fracture. Prevertebral soft tissue contour is normal.  Nodular enlarged and heterogeneous right lobe of the thyroid with associated calcification is stable compared to prior cervical spine CT of June 2014.  IMPRESSION: 1. Small defect in the skin of the left frontal scalp and mild focal soft tissue swelling with overlying bandage material in the region of the left supraorbital rim. 2. No acute intracranial abnormality. 3. Moderate chronic periventricular white matter disease. 4. No evidence of acute bony cervical spine injury.    Electronically Signed   By: Britta MccreedySusan  Turner M.D.   On: 12/20/2013 17:32   Ct Cervical Spine Wo Contrast  12/20/2013   CLINICAL DATA:  80 year old patient with dementia. Found face down on the ground.  EXAM: CT HEAD WITHOUT CONTRAST  CT CERVICAL SPINE WITHOUT CONTRAST  TECHNIQUE: Multidetector CT imaging of the head and cervical spine was performed following the standard protocol without intravenous contrast. Multiplanar CT image reconstructions of the cervical spine were also generated.  COMPARISON:  09/30/2013 in 11/2017/2013, 01/24/2013.  FINDINGS: CT HEAD FINDINGS  There is a small defect in the skin surface of the left forehead superiorly. Additionally, there is a bandage and focal soft tissue swelling the level of the left supraorbital rim. The skull is intact. Multiple benign appearing rounded lucencies in the occipital skull are unchanged dating back to a least 2013. No evidence of skull fracture.  There is moderate chronic microvascular ischemic change in the periventricular white matter bilaterally. Negative for hemorrhage, hydrocephalus, mass effect, mass lesion, or evidence of acute cortically based infarction.  CT CERVICAL SPINE FINDINGS  The cervical spine is aligned from the skullbase through the T4-T5 junction. There is disc height narrowing at C5-6 and C6-7. Negative for fracture. Prevertebral soft tissue contour is normal.  Nodular enlarged and heterogeneous right lobe of the thyroid with associated calcification is stable compared to prior cervical spine CT of June 2014.  IMPRESSION: 1. Small defect in the skin of the left frontal scalp and mild focal soft tissue swelling with overlying bandage material in the region of the left supraorbital rim. 2. No acute intracranial abnormality. 3. Moderate chronic periventricular white matter disease. 4. No evidence of acute bony cervical spine injury.   Electronically Signed   By: Britta MccreedySusan  Turner M.D.   On: 12/20/2013 17:32     EKG Interpretation None       Date:  12/20/2013  Rate: 96  Rhythm: normal sinus rhythm  QRS Axis: normal  Intervals: normal  ST/T Wave abnormalities: nonspecific ST changes  Conduction Disutrbances:none  Narrative Interpretation:   Old EKG Reviewed: unchanged    MDM   Final diagnoses:  None    Patient is a 80 y.o. female with PMH as above and relevant for demenita, HTN, prior UTIs, and PNA who presents after being found down at [facility].  AF and VS initially remarkable for RR in the 40s but after removal or long board she had normal RR.  PE as above and remarkable for abrasions/lacerations to extermities and head.  Patient alert but not oriented x3 which per NH she is normally able to state name.  I spoke with NH and daughter and they think patient has UTI because she usually becomes weak and falls when UTI present. CT of head/c-spine and XRs of extremities/t spine/l spine completed to rule out traumatic injury.  Review of imaging negative for traumatic injury.  Laceration to forehead repaired as above with dermabond.  Given unwitnessed fall it was felt that CXR, EKG, and labs were warranted and all were unremarkable.  Review of urine studies shows evidence of UTI.  Patient's daughter arrived and noted that while in the ED, patient gradually returned to her baseline MS.  With patient being back to baseline and workup only remarkable for UTI, will treat with bactrim and have patient follow up with PCP at [facility]  Daughter and patient were in agreement with plan.      Johnney Ou, MD 12/20/13 2123

## 2013-12-20 NOTE — ED Notes (Signed)
Patient still away at CT and Xray.  

## 2013-12-20 NOTE — ED Notes (Signed)
Spoke with daughter about patient. Made aware that patient needed scans to be clear before collar could be removed. Daughter verbalized understanding.

## 2013-12-20 NOTE — ED Notes (Signed)
NT at the bedside obtaining Urine Sample. [facility]lar [facility].

## 2013-12-20 NOTE — ED Notes (Signed)
Attempted IV x2. Unable to gain access. Laverda PageLindsay Roberts, RN asked to attempt.

## 2013-12-20 NOTE — ED Notes (Signed)
Spoke with pt's daughter re: concerns about [facility]. Daughter expressed that she was feeling better about pt's fall/injuries since speaking with the MD and believes pt's injuries were accidental..  Daughter encouraged to speak with the facility's administrator re: staffing/ pt care concerns.  CSW also provided daughter with contact information for pt's Ombudsman.  Emotional support provided.

## 2013-12-20 NOTE — ED Notes (Signed)
MD at the bedside. Patient log rolled and back board removed. MD assessed spine during movement.

## 2013-12-20 NOTE — ED Provider Notes (Addendum)
I saw and evaluated the patient, reviewed the resident's note and I agree with the findings and plan.  I was personally present for the essential portions of the laceration repair   EKG Interpretation None     Family feels the patient is returned to baseline mental status.  Urinary tract infection will be treated.  Dg Chest 1 View  12/20/2013   CLINICAL DATA:  Larey SeatFell today.  Unable to localized pain.  EXAM: CHEST - 1 VIEW  COMPARISON:  09/30/2013  FINDINGS: Heart size is normal. Lungs are hyperinflated. No focal consolidations or pleural effusions are identified. No pulmonary edema.  IMPRESSION: 1. Bronchitic changes. 2.  No focal acute pulmonary abnormality.   Electronically Signed   By: Rosalie GumsBeth  Brown M.D.   On: 12/20/2013 19:26   Dg Thoracic Spine 2 View  12/20/2013   CLINICAL DATA:  Larey SeatFell.  Back pain.  EXAM: THORACIC SPINE - 2 VIEW  COMPARISON:  None.  FINDINGS: Normal alignment of the thoracic vertebral bodies. Disc spaces and vertebral bodies are maintained. No acute compression fracture. No abnormal paraspinal soft tissue thickening. The visualized posterior ribs are intact.  IMPRESSION: Normal alignment and no acute bony findings.   Electronically Signed   By: Loralie ChampagneMark  Gallerani M.D.   On: 12/20/2013 19:27   Dg Lumbar Spine Complete  12/20/2013   CLINICAL DATA:  Larey SeatFell.  Back pain.  EXAM: LUMBAR SPINE - COMPLETE 4+ VIEW  COMPARISON:  09/30/2013.  FINDINGS: Stable alignment of the lumbar vertebral bodies. Stable degenerative changes. No acute fracture. Stable aortoiliac calcifications without definite aneurysm. The visualized bony pelvis is grossly intact.  IMPRESSION: Stable alignment and no acute bony findings.   Electronically Signed   By: Loralie ChampagneMark  Gallerani M.D.   On: 12/20/2013 19:27   Dg Forearm Left  12/20/2013   CLINICAL DATA:  Larey SeatFell.  Left forearm pain.  EXAM: LEFT FOREARM - 2 VIEW  COMPARISON:  None.  FINDINGS: The wrist and elbow joints are maintained. No acute forearm fracture.  IMPRESSION: No  acute bony findings.   Electronically Signed   By: Loralie ChampagneMark  Gallerani M.D.   On: 12/20/2013 19:25   Dg Forearm Right  12/20/2013   CLINICAL DATA:  Larey SeatFell.  Forearm pain.  EXAM: RIGHT FOREARM - 2 VIEW  COMPARISON:  None.  FINDINGS: The wrist and elbow joints are maintained. No acute forearm fracture.  IMPRESSION: No acute bony findings.   Electronically Signed   By: Loralie ChampagneMark  Gallerani M.D.   On: 12/20/2013 19:25   Dg Tibia/fibula Right  12/20/2013   CLINICAL DATA:  Larey SeatFell.  Right leg pain.  EXAM: RIGHT TIBIA AND FIBULA - 2 VIEW  COMPARISON:  None.  FINDINGS: The knee and ankle joints are maintained. Advanced medial compartment degenerative changes noted at the knee. Proximal patellar tendon calcifications are noted. No acute fracture.  IMPRESSION: No acute fracture.   Electronically Signed   By: Loralie ChampagneMark  Gallerani M.D.   On: 12/20/2013 19:26   Ct Head Wo Contrast  12/20/2013   CLINICAL DATA:  80 year old patient with dementia. Found face down on the ground.  EXAM: CT HEAD WITHOUT CONTRAST  CT CERVICAL SPINE WITHOUT CONTRAST  TECHNIQUE: Multidetector CT imaging of the head and cervical spine was performed following the standard protocol without intravenous contrast. Multiplanar CT image reconstructions of the cervical spine were also generated.  COMPARISON:  09/30/2013 in 11/2017/2013, 01/24/2013.  FINDINGS: CT HEAD FINDINGS  There is a small defect in the skin surface of the left forehead superiorly. Additionally,  there is a bandage and focal soft tissue swelling the level of the left supraorbital rim. The skull is intact. Multiple benign appearing rounded lucencies in the occipital skull are unchanged dating back to a least 2013. No evidence of skull fracture.  There is moderate chronic microvascular ischemic change in the periventricular white matter bilaterally. Negative for hemorrhage, hydrocephalus, mass effect, mass lesion, or evidence of acute cortically based infarction.  CT CERVICAL SPINE FINDINGS  The cervical  spine is aligned from the skullbase through the T4-T5 junction. There is disc height narrowing at C5-6 and C6-7. Negative for fracture. Prevertebral soft tissue contour is normal.  Nodular enlarged and heterogeneous right lobe of the thyroid with associated calcification is stable compared to prior cervical spine CT of June 2014.  IMPRESSION: 1. Small defect in the skin of the left frontal scalp and mild focal soft tissue swelling with overlying bandage material in the region of the left supraorbital rim. 2. No acute intracranial abnormality. 3. Moderate chronic periventricular white matter disease. 4. No evidence of acute bony cervical spine injury.   Electronically Signed   By: Britta MccreedySusan  Turner M.D.   On: 12/20/2013 17:32   Ct Cervical Spine Wo Contrast  12/20/2013   CLINICAL DATA:  80 year old patient with dementia. Found face down on the ground.  EXAM: CT HEAD WITHOUT CONTRAST  CT CERVICAL SPINE WITHOUT CONTRAST  TECHNIQUE: Multidetector CT imaging of the head and cervical spine was performed following the standard protocol without intravenous contrast. Multiplanar CT image reconstructions of the cervical spine were also generated.  COMPARISON:  09/30/2013 in 11/2017/2013, 01/24/2013.  FINDINGS: CT HEAD FINDINGS  There is a small defect in the skin surface of the left forehead superiorly. Additionally, there is a bandage and focal soft tissue swelling the level of the left supraorbital rim. The skull is intact. Multiple benign appearing rounded lucencies in the occipital skull are unchanged dating back to a least 2013. No evidence of skull fracture.  There is moderate chronic microvascular ischemic change in the periventricular white matter bilaterally. Negative for hemorrhage, hydrocephalus, mass effect, mass lesion, or evidence of acute cortically based infarction.  CT CERVICAL SPINE FINDINGS  The cervical spine is aligned from the skullbase through the T4-T5 junction. There is disc height narrowing at C5-6 and  C6-7. Negative for fracture. Prevertebral soft tissue contour is normal.  Nodular enlarged and heterogeneous right lobe of the thyroid with associated calcification is stable compared to prior cervical spine CT of June 2014.  IMPRESSION: 1. Small defect in the skin of the left frontal scalp and mild focal soft tissue swelling with overlying bandage material in the region of the left supraorbital rim. 2. No acute intracranial abnormality. 3. Moderate chronic periventricular white matter disease. 4. No evidence of acute bony cervical spine injury.   Electronically Signed   By: Britta MccreedySusan  Turner M.D.   On: 12/20/2013 17:32   I personally reviewed the imaging tests through PACS system I reviewed available ER/hospitalization records through the EMR  Results for orders placed during the hospital encounter of 12/20/13  GRAM STAIN      Result Value Ref Range   Specimen Description URINE, CATHETERIZED     Special Requests Normal     Gram Stain       Value: WBC PRESENT,BOTH PMN AND MONONUCLEAR     Multiple bacterial morphotypes present, none predominant. Suggest appropriate recollection if clinically indicated.     CYTOSPIN SLIDE     Gram Stain Report Called to,Read Back  By and Verified With: Johann Capers RN [id number] 12/20/13 A BROWNING   Report Status 12/20/2013 FINAL    CBC WITH DIFFERENTIAL      Result Value Ref Range   WBC 7.7  4.0 - 10.5 K/uL   RBC 4.33  3.87 - 5.11 MIL/uL   Hemoglobin 13.5  12.0 - 15.0 g/dL   HCT 16.1  09.6 - 04.5 %   MCV 95.6  78.0 - 100.0 fL   MCH 31.2  26.0 - 34.0 pg   MCHC 32.6  30.0 - 36.0 g/dL   RDW 40.9  81.1 - 91.4 %   Platelets 261  150 - 400 K/uL   Neutrophils Relative % 75  43 - 77 %   Neutro Abs 5.8  1.7 - 7.7 K/uL   Lymphocytes Relative 16  12 - 46 %   Lymphs Abs 1.2  0.7 - 4.0 K/uL   Monocytes Relative 7  3 - 12 %   Monocytes Absolute 0.6  0.1 - 1.0 K/uL   Eosinophils Relative 1  0 - 5 %   Eosinophils Absolute 0.1  0.0 - 0.7 K/uL   Basophils Relative 1  0 - 1 %    Basophils Absolute 0.1  0.0 - 0.1 K/uL  BASIC METABOLIC PANEL      Result Value Ref Range   Sodium 145  137 - 147 mEq/L   Potassium 4.8  3.7 - 5.3 mEq/L   Chloride 109  96 - 112 mEq/L   CO2 20  19 - 32 mEq/L   Glucose, Bld 119 (*) 70 - 99 mg/dL   BUN 22  6 - 23 mg/dL   Creatinine, Ser 7.82  0.50 - 1.10 mg/dL   Calcium 9.4  8.4 - 95.6 mg/dL   GFR calc non Af Amer 46 (*) >90 mL/min   GFR calc Af Amer 53 (*) >90 mL/min  TROPONIN I      Result Value Ref Range   Troponin I <0.30  <0.30 ng/mL  URINALYSIS, ROUTINE W REFLEX MICROSCOPIC      Result Value Ref Range   Color, Urine YELLOW  YELLOW   APPearance CLOUDY (*) CLEAR   Specific Gravity, Urine 1.023  1.005 - 1.030   pH 8.0  5.0 - 8.0   Glucose, UA NEGATIVE  NEGATIVE mg/dL   Hgb urine dipstick NEGATIVE  NEGATIVE   Bilirubin Urine SMALL (*) NEGATIVE   Ketones, ur 15 (*) NEGATIVE mg/dL   Protein, ur NEGATIVE  NEGATIVE mg/dL   Urobilinogen, UA 4.0 (*) 0.0 - 1.0 mg/dL   Nitrite POSITIVE (*) NEGATIVE   Leukocytes, UA LARGE (*) NEGATIVE  URINE MICROSCOPIC-ADD ON      Result Value Ref Range   Squamous Epithelial / LPF RARE  RARE   WBC, UA 11-20  <3 WBC/hpf   RBC / HPF 0-2  <3 RBC/hpf   Bacteria, UA MANY (*) RARE     Lyanne Co, MD 12/20/13 2130  Lyanne Co, MD 01/16/14 [phone]

## 2013-12-20 NOTE — ED Notes (Signed)
Patient returned from CT. Patient made comfortable and put back on the cardiac monitor.

## 2013-12-22 LAB — URINE CULTURE: SPECIAL REQUESTS: NORMAL

## 2014-03-25 ENCOUNTER — Encounter (HOSPITAL_COMMUNITY): Payer: Self-pay | Admitting: Emergency Medicine

## 2014-03-25 ENCOUNTER — Emergency Department (HOSPITAL_COMMUNITY)
Admission: EM | Admit: 2014-03-25 | Discharge: 2014-03-25 | Disposition: A | Discharge status: Home / Self Care | Payer: Medicare Other | Attending: Emergency Medicine | Admitting: Emergency Medicine

## 2014-03-25 DIAGNOSIS — Z792 Long term (current) use of antibiotics: Secondary | ICD-10-CM | POA: Insufficient documentation

## 2014-03-25 DIAGNOSIS — L02419 Cutaneous abscess of limb, unspecified: Secondary | ICD-10-CM | POA: Diagnosis not present

## 2014-03-25 DIAGNOSIS — L03116 Cellulitis of left lower limb: Secondary | ICD-10-CM

## 2014-03-25 DIAGNOSIS — M79609 Pain in unspecified limb: Secondary | ICD-10-CM | POA: Insufficient documentation

## 2014-03-25 DIAGNOSIS — Z880 Allergy status to penicillin: Secondary | ICD-10-CM | POA: Diagnosis not present

## 2014-03-25 DIAGNOSIS — L03119 Cellulitis of unspecified part of limb: Principal | ICD-10-CM

## 2014-03-25 DIAGNOSIS — Z79899 Other long term (current) drug therapy: Secondary | ICD-10-CM | POA: Insufficient documentation

## 2014-03-25 DIAGNOSIS — I10 Essential (primary) hypertension: Secondary | ICD-10-CM | POA: Insufficient documentation

## 2014-03-25 DIAGNOSIS — G309 Alzheimer's disease, unspecified: Secondary | ICD-10-CM | POA: Diagnosis not present

## 2014-03-25 DIAGNOSIS — Z87448 Personal history of other diseases of urinary system: Secondary | ICD-10-CM | POA: Diagnosis not present

## 2014-03-25 DIAGNOSIS — Z8719 Personal history of other diseases of the digestive system: Secondary | ICD-10-CM | POA: Diagnosis not present

## 2014-03-25 DIAGNOSIS — F0280 Dementia in other diseases classified elsewhere without behavioral disturbance: Secondary | ICD-10-CM | POA: Insufficient documentation

## 2014-03-25 DIAGNOSIS — Z8701 Personal history of pneumonia (recurrent): Secondary | ICD-10-CM | POA: Insufficient documentation

## 2014-03-25 DIAGNOSIS — M109 Gout, unspecified: Secondary | ICD-10-CM | POA: Diagnosis not present

## 2014-03-25 MED ORDER — VANCOMYCIN HCL 10 G IV SOLR
1500.0000 mg | Freq: Once | INTRAVENOUS | Status: AC
  Administered 2014-03-25: 1500 mg via INTRAVENOUS
  Filled 2014-03-25: qty 1500

## 2014-03-25 MED ORDER — CLINDAMYCIN HCL 300 MG PO CAPS: 300.0000 mg | ORAL_CAPSULE | Freq: Three times a day (TID) | ORAL | Status: DC

## 2014-03-25 MED ORDER — OLANZAPINE 5 MG PO TABS
5.0000 mg | ORAL_TABLET | Freq: Every day | ORAL | Status: DC
  Filled 2014-03-25: qty 1

## 2014-03-25 MED ORDER — SODIUM CHLORIDE 0.9 % IV SOLN
Freq: Once | INTRAVENOUS | Status: AC
  Administered 2014-03-25: 16:00:00 via INTRAVENOUS

## 2014-03-25 MED ORDER — OLANZAPINE 5 MG PO TABS
5.0000 mg | ORAL_TABLET | Freq: Every day | ORAL | Status: DC
  Administered 2014-03-25: 5 mg via ORAL

## 2014-03-25 NOTE — ED Notes (Signed)
Bed: [id number] Expected date:  Expected time:  Means of arrival:  Comments: ems- dementia and leg swelling

## 2014-03-25 NOTE — Discharge Instructions (Signed)

## 2014-03-25 NOTE — ED Notes (Signed)
Per EMS pt is coming [facility]hospice [facility]) with c/o bilateral lower extremities edema and left leg pain and redness.

## 2014-03-25 NOTE — ED Notes (Signed)
Family was afraid pt was allergic to "mycin" drugs. Daughter called other family member and nursing home to confirm allergies. Reports pt with other allergies breaks out in hives. Pt has no hives present, pt being monitored. No change in vitals since abx started infusing.

## 2014-03-25 NOTE — ED Provider Notes (Signed)
CSN: [id number]     Arrival date & time 03/25/14  1423 History   First MD Initiated Contact with Patient 03/25/14 1504     Chief Complaint  Patient presents with  . Leg Pain  . Leg Swelling     (Consider location/radiation/quality/duration/timing/severity/associated sxs/prior Treatment) HPI  87yF presenting with daughter for evaluation of LLE redness/swelling. Pt on hospice and resides at [facility]. Caretaker noted skin changes today and recommended she come for evaluation. Per daughter, pt seems to be at her baseline. No reported fever. No vomiting. Pt is essentially bed bound. No recent falls, but sometimes get skin tears when bumping legs on things or when moving her.   Past Medical History  Diagnosis Date  . Hypertension   . Alzheimer's dementia   . Pneumonia   . Gout   . Tachycardia   . Chronic renal insufficiency   . Hernia    Past Surgical History  Procedure Laterality Date  . Cholecystectomy    . Abdominal hysterectomy    . Colon surgery    . Stomach surgery     No family history on file. History  Substance Use Topics  . Smoking status: Never Smoker   . Smokeless tobacco: Never Used  . Alcohol Use: No   OB History   Grav Para Term Preterm Abortions TAB SAB Ect Mult Living            3     Review of Systems  Level 5 caveat because has dementia/unable to provide reliable ROS.   Allergies  Cephalosporins; Macrolides and ketolides; and Penicillins  Home Medications   Prior to Admission medications   Medication Sig Start Date End Date Taking? Authorizing Provider  acetaminophen (TYLENOL) 500 MG tablet Take 500 mg by mouth every 4 (four) hours as needed for pain or fever.    Yes Historical Provider, MD  acidophilus (RISAQUAD) CAPS capsule Take 1 capsule by mouth daily.  08/06/13  Yes Historical Provider, MD  alum & mag hydroxide-simeth (GERI-LANTA) 200-200-20 MG/5ML suspension Take 30 mLs by mouth 4 (four) times daily as needed for indigestion or  heartburn.   Yes Historical Provider, MD  amLODipine (NORVASC) 5 MG tablet Take 5 mg by mouth every morning.    Yes Historical Provider, MD  Artificial Tear Ointment (REFRESH LACRI-LUBE) OINT Place 1 strip into both eyes 2 (two) times daily.   Yes Historical Provider, MD  calcium carbonate (OS-CAL) 600 MG TABS Take 600 mg by mouth every morning.    Yes Historical Provider, MD  Cholecalciferol (VITAMIN D3) 5000 UNITS TABS Take 5,000 Units by mouth daily with breakfast.    Yes Historical Provider, MD  Cranberry 450 MG CAPS Take 1 capsule by mouth 2 (two) times daily.   Yes Historical Provider, MD  divalproex (DEPAKOTE SPRINKLE) 125 MG capsule Take 250 mg by mouth 4 (four) times daily.   Yes Historical Provider, MD  FLUoxetine (PROZAC) 10 MG capsule Take 10 mg by mouth daily. Take with 1 capsule orally every morning with 20 mg to equal  30 mg.   Yes Historical Provider, MD  FLUoxetine (PROZAC) 20 MG capsule Take 20 mg by mouth every morning. Take 1 capsule every morning with 10 mg to equal 30 mg.   Yes Historical Provider, MD  guaifenesin (Q-TUSSIN) 100 MG/5ML syrup Take 200 mg by mouth every 6 (six) hours as needed for cough.   Yes Historical Provider, MD  HYDROcodone-acetaminophen (NORCO/VICODIN) 5-325 MG per tablet Take 1 tablet by mouth every  8 (eight) hours as needed for moderate pain.   Yes Historical Provider, MD  hydroxypropyl methylcellulose (ISOPTO TEARS) 2.5 % ophthalmic solution Place 2 drops into both eyes 3 (three) times daily as needed for dry eyes.   Yes Historical Provider, MD  loperamide (IMODIUM A-D) 2 MG tablet Take 2 mg by mouth as needed for diarrhea or loose stools.    Yes Historical Provider, MD  loratadine (CLARITIN) 10 MG tablet Take 10 mg by mouth daily after breakfast.   Yes Historical Provider, MD  magnesium hydroxide (MILK OF MAGNESIA) 400 MG/5ML suspension Take 30 mLs by mouth at bedtime as needed for constipation.    Yes Historical Provider, MD  Memantine HCl ER (NAMENDA  XR) 28 MG CP24 Take 1 capsule by mouth daily.   Yes Historical Provider, MD  mirtazapine (REMERON) 30 MG tablet Take 30 mg by mouth at bedtime.   Yes Historical Provider, MD  Neomycin-Bacitracin-Polymyxin (TRIPLE ANTIBIOTIC) [phone] OINT Apply 1 application topically as needed (for wound care).   Yes Historical Provider, MD  NUTRITIONAL SUPPLEMENT LIQD Take 1 Bottle by mouth 3 (three) times daily with meals. Mighty Shakes   Yes Historical Provider, MD  nystatin (MYCOSTATIN/NYSTOP) 100000 UNIT/GM POWD Apply 1 g topically 2 (two) times daily.   Yes Historical Provider, MD  OLANZapine (ZYPREXA) 10 MG tablet Take 5 mg by mouth 2 (two) times daily.   Yes Historical Provider, MD  omeprazole (PRILOSEC) 20 MG capsule Take 20 mg by mouth 2 (two) times daily. At 0800 an 1600   Yes Historical Provider, MD  Ostomy Supplies (SKIN PREP SPRAY) MISC Apply 1 application topically as needed (For wound care). Apply to left heel as needed for wound care   Yes Historical Provider, MD  potassium chloride SA (K-DUR,KLOR-CON) 20 MEQ tablet Take 20 mEq by mouth every morning.    Yes Historical Provider, MD  PRESCRIPTION MEDICATION Apply 1 application topically daily. Geri-Sleeve: Use for upper left arm for skin tear   Yes Historical Provider, MD  PRESCRIPTION MEDICATION Apply 0.5 mg topically every 6 (six) hours as needed (for agitation). Lorazepam 0.5mg /ml Transdermal Gel   Yes Historical Provider, MD  rivastigmine (EXELON) 9.5 mg/24hr Place 9.5 mg onto the skin daily.   Yes Historical Provider, MD  triamcinolone cream (KENALOG) 0.1 % Apply 1 application topically 2 (two) times daily as needed (for facial redness).   Yes Historical Provider, MD  vitamin B-12 (CYANOCOBALAMIN) 1000 MCG tablet Take 1,000 mcg by mouth every morning.    Yes Historical Provider, MD  Wound Dressings (MEDIHONEY WOUND/BURN DRESSING) GEL Apply 1 application topically every other day. As needed for wound care   Yes Historical Provider, MD   BP  115/66  Pulse 87  Temp(Src) 97.5 F (36.4 C) (Oral)  Resp 16  SpO2 96% Physical Exam  Nursing note and vitals reviewed. Constitutional: She appears well-developed and well-nourished. No distress.  HENT:  Head: Normocephalic and atraumatic.  Eyes: Conjunctivae are normal. Right eye exhibits no discharge. Left eye exhibits no discharge.  Neck: Neck supple.  Cardiovascular: Normal rate, regular rhythm and normal heart sounds.  Exam reveals no gallop and no friction rub.   No murmur heard. Pulmonary/Chest: Effort normal and breath sounds normal. No respiratory distress.  Abdominal: Soft. She exhibits no distension. There is no tenderness.  Musculoskeletal: She exhibits no edema and no tenderness.  Neurological: She is alert.  Skin: Skin is warm and dry.       ED Course  Procedures (including critical care  time) Labs Review Labs Reviewed - No data to display  Imaging Review No results found.   EKG Interpretation None      MDM   Final diagnoses:  Cellulitis of left lower leg    87yF with cellulitis of LLE. Likely from skin tear proximal L shin. Afebrile. Reportedly at her baseline. I feel appropriate for outpt treatment at this time. Will start on clindamycin although would rather avoid it, particularly in elderly patient. PCN, cephalosporin and macrolide allergies limit oral therapy that will adequately cover for strep though.     Raeford Razor, MD 03/28/14 [phone]

## 2014-04-19 ENCOUNTER — Emergency Department (HOSPITAL_COMMUNITY): Payer: Medicare Other

## 2014-04-19 ENCOUNTER — Emergency Department (HOSPITAL_COMMUNITY)
Admission: EM | Admit: 2014-04-19 | Discharge: 2014-04-19 | Disposition: A | Discharge status: Skilled Nursing Facility | Payer: Medicare Other | Attending: Emergency Medicine | Admitting: Emergency Medicine

## 2014-04-19 ENCOUNTER — Encounter (HOSPITAL_COMMUNITY): Payer: Self-pay | Admitting: Emergency Medicine

## 2014-04-19 DIAGNOSIS — Z8719 Personal history of other diseases of the digestive system: Secondary | ICD-10-CM | POA: Diagnosis not present

## 2014-04-19 DIAGNOSIS — N189 Chronic kidney disease, unspecified: Secondary | ICD-10-CM | POA: Insufficient documentation

## 2014-04-19 DIAGNOSIS — S0990XA Unspecified injury of head, initial encounter: Secondary | ICD-10-CM | POA: Diagnosis not present

## 2014-04-19 DIAGNOSIS — F0280 Dementia in other diseases classified elsewhere without behavioral disturbance: Secondary | ICD-10-CM | POA: Diagnosis not present

## 2014-04-19 DIAGNOSIS — Y939 Activity, unspecified: Secondary | ICD-10-CM | POA: Insufficient documentation

## 2014-04-19 DIAGNOSIS — Z8632 Personal history of gestational diabetes: Secondary | ICD-10-CM | POA: Insufficient documentation

## 2014-04-19 DIAGNOSIS — I129 Hypertensive chronic kidney disease with stage 1 through stage 4 chronic kidney disease, or unspecified chronic kidney disease: Secondary | ICD-10-CM | POA: Diagnosis not present

## 2014-04-19 DIAGNOSIS — G309 Alzheimer's disease, unspecified: Secondary | ICD-10-CM | POA: Insufficient documentation

## 2014-04-19 DIAGNOSIS — Z8701 Personal history of pneumonia (recurrent): Secondary | ICD-10-CM | POA: Diagnosis not present

## 2014-04-19 DIAGNOSIS — W19XXXA Unspecified fall, initial encounter: Secondary | ICD-10-CM | POA: Insufficient documentation

## 2014-04-19 DIAGNOSIS — Z880 Allergy status to penicillin: Secondary | ICD-10-CM | POA: Diagnosis not present

## 2014-04-19 DIAGNOSIS — Y92009 Unspecified place in unspecified non-institutional (private) residence as the place of occurrence of the external cause: Secondary | ICD-10-CM | POA: Insufficient documentation

## 2014-04-19 DIAGNOSIS — Z79899 Other long term (current) drug therapy: Secondary | ICD-10-CM | POA: Diagnosis not present

## 2014-04-19 DIAGNOSIS — Z792 Long term (current) use of antibiotics: Secondary | ICD-10-CM | POA: Insufficient documentation

## 2014-04-19 DIAGNOSIS — Z8679 Personal history of other diseases of the circulatory system: Secondary | ICD-10-CM | POA: Insufficient documentation

## 2014-04-19 NOTE — ED Notes (Signed)
Called [facility] to take pt back to [facility].

## 2014-04-19 NOTE — ED Notes (Signed)
Attempted to call [facility] to give report, but received answering machine.

## 2014-04-19 NOTE — ED Notes (Signed)
Pt BIB EMS. Pt is from [facility]. Pt was found on floor, unwitnessed fall. Pt has some redness to cheeks. Pt c/o neck and back pain per EMS. Pt has skin tear to L forearm. Pt arrives with KED board in place. Pt anxious and calling out repeatedly.

## 2014-04-19 NOTE — ED Notes (Signed)
[facility] here to take pt back to [facility].

## 2014-04-19 NOTE — ED Notes (Signed)
Bed: WA23 Expected date:  Expected time:  Means of arrival:  Comments: EMS-fall 

## 2014-04-19 NOTE — Discharge Instructions (Signed)
Fall Prevention and Home Safety °Falls cause injuries and can affect all age groups. It is possible to use preventive measures to significantly decrease the likelihood of falls. There are many simple measures which can make your home safer and prevent falls. °OUTDOORS °· Repair cracks and edges of walkways and driveways. °· Remove high doorway thresholds. °· Trim shrubbery on the main path into your home. °· Have good outside lighting. °· Clear walkways of tools, rocks, debris, and clutter. °· Check that handrails are not broken and are securely fastened. Both sides of steps should have handrails. °· Have leaves, snow, and ice cleared regularly. °· Use sand or salt on walkways during winter months. °· In the garage, clean up grease or oil spills. °BATHROOM °· Install night lights. °· Install grab bars by the toilet and in the tub and shower. °· Use non-skid mats or decals in the tub or shower. °· Place a plastic non-slip stool in the shower to sit on, if needed. °· Keep floors dry and clean up all water on the floor immediately. °· Remove soap buildup in the tub or shower on a regular basis. °· Secure bath mats with non-slip, double-sided rug tape. °· Remove throw rugs and tripping hazards from the floors. °BEDROOMS °· Install night lights. °· Make sure a bedside light is easy to reach. °· Do not use oversized bedding. °· Keep a telephone by your bedside. °· Have a firm chair with side arms to use for getting dressed. °· Remove throw rugs and tripping hazards from the floor. °KITCHEN °· Keep handles on pots and pans turned toward the center of the stove. Use back burners when possible. °· Clean up spills quickly and allow time for drying. °· Avoid walking on wet floors. °· Avoid hot utensils and knives. °· Position shelves so they are not too high or low. °· Place commonly used objects within easy reach. °· If necessary, use a sturdy step stool with a grab bar when reaching. °· Keep electrical cables out of the  way. °· Do not use floor polish or wax that makes floors slippery. If you must use wax, use non-skid floor wax. °· Remove throw rugs and tripping hazards from the floor. °STAIRWAYS °· Never leave objects on stairs. °· Place handrails on both sides of stairways and use them. Fix any loose handrails. Make sure handrails on both sides of the stairways are as long as the stairs. °· Check carpeting to make sure it is firmly attached along stairs. Make repairs to worn or loose carpet promptly. °· Avoid placing throw rugs at the top or bottom of stairways, or properly secure the rug with carpet tape to prevent slippage. Get rid of throw rugs, if possible. °· Have an electrician put in a light switch at the top and bottom of the stairs. °OTHER FALL PREVENTION TIPS °· Wear low-heel or rubber-soled shoes that are supportive and fit well. Wear closed toe shoes. °· When using a stepladder, make sure it is fully opened and both spreaders are firmly locked. Do not climb a closed stepladder. °· Add color or contrast paint or tape to grab bars and handrails in your home. Place contrasting color strips on first and last steps. °· Learn and use mobility aids as needed. Install an electrical emergency response system. °· Turn on lights to avoid dark areas. Replace light bulbs that burn out immediately. Get light switches that glow. °· Arrange furniture to create clear pathways. Keep furniture in the same place. °·   Firmly attach carpet with non-skid or double-sided tape. °· Eliminate uneven floor surfaces. °· Select a carpet pattern that does not visually hide the edge of steps. °· Be aware of all pets. °OTHER HOME SAFETY TIPS °· Set the water temperature for 120° F (48.8° C). °· Keep emergency numbers on or near the telephone. °· Keep smoke detectors on every level of the home and near sleeping areas. °Document Released: 07/23/2002 Document Revised: 02/01/2012 Document Reviewed: 10/22/2011 °[employer]® Patient Information ©2015  [employer]. This information is not intended to replace advice given to you by your health care provider. Make sure you discuss any questions you have with your health care provider. ° ° °Possible Head Injury °You have received a head injury. It does not appear serious at this time. Headaches and vomiting are common following head injury. It should be easy to awaken from sleeping. Sometimes it is necessary for you to stay in the emergency department for a while for observation. Sometimes admission to the hospital may be needed. After injuries such as yours, most problems occur within the first 24 hours, but side effects may occur up to 7-10 days after the injury. It is important for you to carefully monitor your condition and contact your health care provider or seek immediate medical care if there is a change in your condition. °WHAT ARE THE TYPES OF HEAD INJURIES? °Head injuries can be as minor as a bump. Some head injuries can be more severe. More severe head injuries include: °· A jarring injury to the brain (concussion). °· A bruise of the brain (contusion). This mean there is bleeding in the brain that can cause swelling. °· A cracked skull (skull fracture). °· Bleeding in the brain that collects, clots, and forms a bump (hematoma). °WHAT CAUSES A HEAD INJURY? °A serious head injury is most likely to happen to someone who is in a car wreck and is not wearing a seat belt. Other causes of major head injuries include bicycle or motorcycle accidents, sports injuries, and falls. °HOW ARE HEAD INJURIES DIAGNOSED? °A complete history of the event leading to the injury and your current symptoms will be helpful in diagnosing head injuries. Many times, pictures of the brain, such as CT or MRI are needed to see the extent of the injury. Often, an overnight hospital stay is necessary for observation.  °WHEN SHOULD I SEEK IMMEDIATE MEDICAL CARE?  °You should get help right away if: °· You have confusion or  drowsiness. °· You feel sick to your stomach (nauseous) or have continued, forceful vomiting. °· You have dizziness or unsteadiness that is getting worse. °· You have severe, continued headaches not relieved by medicine. Only take over-the-counter or prescription medicines for pain, fever, or discomfort as directed by your health care provider. °· You do not have normal function of the arms or legs or are unable to walk. °· You notice changes in the black spots in the center of the colored part of your eye (pupil). °· You have a clear or bloody fluid coming from your nose or ears. °· You have a loss of vision. °During the next 24 hours after the injury, you must stay with someone who can watch you for the warning signs. This person should contact local emergency services (911 in the [place] if you have seizures, you become unconscious, or you are unable to wake up. °HOW CAN I PREVENT A HEAD INJURY IN THE FUTURE? °The most important factor for preventing major head injuries is avoiding   motor vehicle accidents.  To minimize the potential for damage to your head, it is crucial to wear seat belts while riding in motor vehicles. Wearing helmets while bike riding and playing collision sports (like football) is also helpful. Also, avoiding dangerous activities around the house will further help reduce your risk of head injury.  °WHEN CAN I RETURN TO NORMAL ACTIVITIES AND ATHLETICS? °You should be reevaluated by your health care provider before returning to these activities. If you have any of the following symptoms, you should not return to activities or contact sports until 1 week after the symptoms have stopped: °· Persistent headache. °· Dizziness or vertigo. °· Poor attention and concentration. °· Confusion. °· Memory problems. °· Nausea or vomiting. °· Fatigue or tire easily. °· Irritability. °· Intolerant of bright lights or loud noises. °· Anxiety or depression. °· Disturbed sleep. °MAKE SURE YOU:  °· Understand these  instructions. °· Will watch your condition. °· Will get help right away if you are not doing well or get worse. °Document Released: 08/02/2005 Document Revised: 08/07/2013 Document Reviewed: 04/09/2013 °[employer]® Patient Information ©2015 [employer]. This information is not intended to replace advice given to you by your health care provider. Make sure you discuss any questions you have with your health care provider. ° °

## 2014-04-19 NOTE — ED Provider Notes (Signed)
TIME SEEN: 5:35 PM  CHIEF COMPLAINT: Unwitnessed fall  HPI: Patient is an 80 year old female with history of hypertension, chronic kidney disease, Alzheimer's dementia who had an unwitnessed fall today. Patient is bedbound at baseline and lives in [facility]. History is provided by nursing home staff, EMS and her daughter Lurena Joiner. Patient is currently on hospice. She was found face up on the floor, conscious. She is not on anticoagulation. She seems to be at her baseline. Daughter states that she will repeatedly state "oh lord" and "help me".  ROS: Level V caveat for dementia  PAST MEDICAL HISTORY/PAST SURGICAL HISTORY:  Past Medical History  Diagnosis Date  . Hypertension   . Alzheimer's dementia   . Pneumonia   . Gout   . Tachycardia   . Chronic renal insufficiency   . Hernia     MEDICATIONS:  Prior to Admission medications   Medication Sig Start Date End Date Taking? Authorizing Provider  acidophilus (RISAQUAD) CAPS capsule Take 1 capsule by mouth daily.  08/06/13  Yes Historical Provider, MD  amLODipine (NORVASC) 5 MG tablet Take 5 mg by mouth every morning.    Yes Historical Provider, MD  acetaminophen (TYLENOL) 500 MG tablet Take 500 mg by mouth every 4 (four) hours as needed for pain or fever.     Historical Provider, MD  alum & mag hydroxide-simeth (GERI-LANTA) 200-200-20 MG/5ML suspension Take 30 mLs by mouth 4 (four) times daily as needed for indigestion or heartburn.    Historical Provider, MD  Artificial Tear Ointment (REFRESH LACRI-LUBE) OINT Place 1 strip into both eyes 2 (two) times daily.    Historical Provider, MD  calcium carbonate (OS-CAL) 600 MG TABS Take 600 mg by mouth every morning.     Historical Provider, MD  Cholecalciferol (VITAMIN D3) 5000 UNITS TABS Take 5,000 Units by mouth daily with breakfast.     Historical Provider, MD  clindamycin (CLEOCIN) 300 MG capsule Take 1 capsule (300 mg total) by mouth 3 (three) times daily. 03/25/14   Raeford Razor, MD   Cranberry 450 MG CAPS Take 1 capsule by mouth 2 (two) times daily.    Historical Provider, MD  divalproex (DEPAKOTE SPRINKLE) 125 MG capsule Take 250 mg by mouth 4 (four) times daily.    Historical Provider, MD  FLUoxetine (PROZAC) 10 MG capsule Take 10 mg by mouth daily. Take with 1 capsule orally every morning with 20 mg to equal  30 mg.    Historical Provider, MD  FLUoxetine (PROZAC) 20 MG capsule Take 20 mg by mouth every morning. Take 1 capsule every morning with 10 mg to equal 30 mg.    Historical Provider, MD  guaifenesin (Q-TUSSIN) 100 MG/5ML syrup Take 200 mg by mouth every 6 (six) hours as needed for cough.    Historical Provider, MD  HYDROcodone-acetaminophen (NORCO/VICODIN) 5-325 MG per tablet Take 1 tablet by mouth every 8 (eight) hours as needed for moderate pain.    Historical Provider, MD  hydroxypropyl methylcellulose (ISOPTO TEARS) 2.5 % ophthalmic solution Place 2 drops into both eyes 3 (three) times daily as needed for dry eyes.    Historical Provider, MD  loperamide (IMODIUM A-D) 2 MG tablet Take 2 mg by mouth as needed for diarrhea or loose stools.     Historical Provider, MD  loratadine (CLARITIN) 10 MG tablet Take 10 mg by mouth daily after breakfast.    Historical Provider, MD  magnesium hydroxide (MILK OF MAGNESIA) 400 MG/5ML suspension Take 30 mLs by mouth at bedtime as  needed for constipation.     Historical Provider, MD  Memantine HCl ER (NAMENDA XR) 28 MG CP24 Take 1 capsule by mouth daily.    Historical Provider, MD  mirtazapine (REMERON) 30 MG tablet Take 30 mg by mouth at bedtime.    Historical Provider, MD  Neomycin-Bacitracin-Polymyxin (TRIPLE ANTIBIOTIC) [phone] OINT Apply 1 application topically as needed (for wound care).    Historical Provider, MD  NUTRITIONAL SUPPLEMENT LIQD Take 1 Bottle by mouth 3 (three) times daily with meals. Mighty Shakes    Historical Provider, MD  nystatin (MYCOSTATIN/NYSTOP) 100000 UNIT/GM POWD Apply 1 g topically 2 (two) times  daily.    Historical Provider, MD  OLANZapine (ZYPREXA) 10 MG tablet Take 5 mg by mouth 2 (two) times daily.    Historical Provider, MD  omeprazole (PRILOSEC) 20 MG capsule Take 20 mg by mouth 2 (two) times daily. At 0800 an 1600    Historical Provider, MD  Ostomy Supplies (SKIN PREP SPRAY) MISC Apply 1 application topically as needed (For wound care). Apply to left heel as needed for wound care    Historical Provider, MD  potassium chloride SA (K-DUR,KLOR-CON) 20 MEQ tablet Take 20 mEq by mouth every morning.     Historical Provider, MD  PRESCRIPTION MEDICATION Apply 1 application topically daily. Geri-Sleeve: Use for upper left arm for skin tear    Historical Provider, MD  PRESCRIPTION MEDICATION Apply 0.5 mg topically every 6 (six) hours as needed (for agitation). Lorazepam 0.5mg /ml Transdermal Gel    Historical Provider, MD  rivastigmine (EXELON) 9.5 mg/24hr Place 9.5 mg onto the skin daily.    Historical Provider, MD  triamcinolone cream (KENALOG) 0.1 % Apply 1 application topically 2 (two) times daily as needed (for facial redness).    Historical Provider, MD  vitamin B-12 (CYANOCOBALAMIN) 1000 MCG tablet Take 1,000 mcg by mouth every morning.     Historical Provider, MD  Wound Dressings (MEDIHONEY WOUND/BURN DRESSING) GEL Apply 1 application topically every other day. As needed for wound care    Historical Provider, MD    ALLERGIES:  Allergies  Allergen Reactions  . Cephalosporins Other (See Comments)    Per Pt MAR  . Macrolides And Ketolides Other (See Comments)    Per pt MAR  . Penicillins Other (See Comments)    Per pt MAR    SOCIAL HISTORY:  History  Substance Use Topics  . Smoking status: Never Smoker   . Smokeless tobacco: Never Used  . Alcohol Use: No    FAMILY HISTORY: No family history on file.  EXAM: BP 125/69  Pulse 96  Temp(Src) 97.7 F (36.5 C) (Oral)  Resp 30  SpO2 94% CONSTITUTIONAL: Alert but does not answer questions appropriately or follow commands,  elderly, chronically ill-appearing HEAD: Normocephalic; atraumatic EYES: Conjunctivae clear, PERRL, EOMI ENT: normal nose; no rhinorrhea; moist mucous membranes; pharynx without lesions noted; no dental injury;  no septal hematoma NECK: Supple, no meningismus, no LAD; no midline spinal tenderness, step-off or deformity CARD: RRR; S1 and S2 appreciated; no murmurs, no clicks, no rubs, no gallops RESP: Normal chest excursion without splinting or tachypnea; breath sounds clear and equal bilaterally; no wheezes, no rhonchi, no rales; chest wall stable, nontender to palpation ABD/GI: Normal bowel sounds; non-distended; soft, non-tender, no rebound, no guarding PELVIS:  stable, nontender to palpation BACK:  The back appears normal and is non-tender to palpation, there is no CVA tenderness; no midline spinal tenderness, step-off or deformity EXT: Patient's left leg seems externally rotated and  longer than the right but she has no pain on palpation of either hip and is able to flex her hips without difficulty, Normal ROM in all joints; non-tender to palpation; no edema; normal capillary refill; no cyanosis, 2+ DP pulses bilaterally    SKIN: Normal color for age and race; warm NEURO: Moves all extremities equally; no facial droop; no slurred speech PSYCH: The patient's mood and manner are appropriate. Grooming and personal hygiene are appropriate.  MEDICAL DECISION MAKING: Patient here with unwitnessed fall. Exam and history are limited given patient's dementia. Discussed with her daughter Lurena Joiner who would like imaging performed today. We'll obtain pelvis x-ray, chest x-ray, CT of her head and cervical spine.  ED PROGRESS: Patient's imaging shows no acute findings. Left a message with her daughter Lurena Joiner at [phone].  We'll discharge patient back to her nursing facility. She is stable, at her baseline neurologically, hemodynamically stable.     Layla Maw Shadasia Oldfield, DO 04/19/14 1934

## 2014-04-19 NOTE — ED Notes (Signed)
Patient transported to X-ray 

## 2014-05-10 ENCOUNTER — Emergency Department (HOSPITAL_COMMUNITY)
Admission: EM | Admit: 2014-05-10 | Discharge: 2014-05-10 | Disposition: A | Discharge status: Home / Self Care | Payer: Medicare Other | Attending: Emergency Medicine | Admitting: Emergency Medicine

## 2014-05-10 ENCOUNTER — Encounter (HOSPITAL_COMMUNITY): Payer: Self-pay | Admitting: Emergency Medicine

## 2014-05-10 ENCOUNTER — Emergency Department (HOSPITAL_COMMUNITY): Payer: Medicare Other

## 2014-05-10 DIAGNOSIS — Z880 Allergy status to penicillin: Secondary | ICD-10-CM | POA: Diagnosis not present

## 2014-05-10 DIAGNOSIS — I10 Essential (primary) hypertension: Secondary | ICD-10-CM | POA: Diagnosis not present

## 2014-05-10 DIAGNOSIS — R296 Repeated falls: Secondary | ICD-10-CM | POA: Diagnosis not present

## 2014-05-10 DIAGNOSIS — Z862 Personal history of diseases of the blood and blood-forming organs and certain disorders involving the immune mechanism: Secondary | ICD-10-CM | POA: Insufficient documentation

## 2014-05-10 DIAGNOSIS — Y9210 Unspecified residential institution as the place of occurrence of the external cause: Secondary | ICD-10-CM | POA: Diagnosis not present

## 2014-05-10 DIAGNOSIS — S51809A Unspecified open wound of unspecified forearm, initial encounter: Secondary | ICD-10-CM | POA: Insufficient documentation

## 2014-05-10 DIAGNOSIS — F0390 Unspecified dementia without behavioral disturbance: Secondary | ICD-10-CM | POA: Insufficient documentation

## 2014-05-10 DIAGNOSIS — Z8639 Personal history of other endocrine, nutritional and metabolic disease: Secondary | ICD-10-CM | POA: Diagnosis not present

## 2014-05-10 DIAGNOSIS — W19XXXA Unspecified fall, initial encounter: Secondary | ICD-10-CM

## 2014-05-10 DIAGNOSIS — Y9389 Activity, other specified: Secondary | ICD-10-CM | POA: Insufficient documentation

## 2014-05-10 DIAGNOSIS — Z87891 Personal history of nicotine dependence: Secondary | ICD-10-CM | POA: Diagnosis not present

## 2014-05-10 DIAGNOSIS — Z87448 Personal history of other diseases of urinary system: Secondary | ICD-10-CM | POA: Diagnosis not present

## 2014-05-10 DIAGNOSIS — N390 Urinary tract infection, site not specified: Secondary | ICD-10-CM

## 2014-05-10 HISTORY — DX: Acute kidney failure, unspecified: N17.9

## 2014-05-10 LAB — COMPREHENSIVE METABOLIC PANEL
ALBUMIN: 2.6 g/dL — AB (ref 3.5–5.2)
ALT: 23 U/L (ref 0–35)
AST: 33 U/L (ref 0–37)
Alkaline Phosphatase: 152 U/L — ABNORMAL HIGH (ref 39–117)
Anion gap: 11 (ref 5–15)
BILIRUBIN TOTAL: 0.4 mg/dL (ref 0.3–1.2)
BUN: 15 mg/dL (ref 6–23)
CHLORIDE: 105 meq/L (ref 96–112)
CO2: 23 mEq/L (ref 19–32)
Calcium: 9.4 mg/dL (ref 8.4–10.5)
Creatinine, Ser: 0.87 mg/dL (ref 0.50–1.10)
GFR calc Af Amer: 68 mL/min — ABNORMAL LOW (ref >90)
GFR calc non Af Amer: 59 mL/min — ABNORMAL LOW (ref >90)
Glucose, Bld: 79 mg/dL (ref 70–99)
Potassium: 4.3 mEq/L (ref 3.7–5.3)
SODIUM: 139 meq/L (ref 137–147)
Total Protein: 6.4 g/dL (ref 6.0–8.3)

## 2014-05-10 LAB — CBC WITH DIFFERENTIAL/PLATELET
BASOS ABS: 0 10*3/uL (ref 0.0–0.1)
BASOS PCT: 1 % (ref 0–1)
Eosinophils Absolute: 0.2 10*3/uL (ref 0.0–0.7)
Eosinophils Relative: 3 % (ref 0–5)
HCT: 40 % (ref 36.0–46.0)
Hemoglobin: 13.1 g/dL (ref 12.0–15.0)
LYMPHS PCT: 33 % (ref 12–46)
Lymphs Abs: 1.8 10*3/uL (ref 0.7–4.0)
MCH: 31.3 pg (ref 26.0–34.0)
MCHC: 32.8 g/dL (ref 30.0–36.0)
MCV: 95.5 fL (ref 78.0–100.0)
MONO ABS: 0.5 10*3/uL (ref 0.1–1.0)
Monocytes Relative: 10 % (ref 3–12)
NEUTROS ABS: 2.9 10*3/uL (ref 1.7–7.7)
NEUTROS PCT: 53 % (ref 43–77)
PLATELETS: 181 10*3/uL (ref 150–400)
RBC: 4.19 MIL/uL (ref 3.87–5.11)
RDW: 13.5 % (ref 11.5–15.5)
WBC: 5.4 10*3/uL (ref 4.0–10.5)

## 2014-05-10 LAB — URINALYSIS, ROUTINE W REFLEX MICROSCOPIC
Bilirubin Urine: NEGATIVE
Glucose, UA: NEGATIVE mg/dL
HGB URINE DIPSTICK: NEGATIVE
Ketones, ur: NEGATIVE mg/dL
Nitrite: POSITIVE — AB
PH: 6 (ref 5.0–8.0)
Protein, ur: NEGATIVE mg/dL
SPECIFIC GRAVITY, URINE: 1.014 (ref 1.005–1.030)
UROBILINOGEN UA: 1 mg/dL (ref 0.0–1.0)

## 2014-05-10 LAB — URINE MICROSCOPIC-ADD ON

## 2014-05-10 LAB — CK: CK TOTAL: 30 U/L (ref 7–177)

## 2014-05-10 LAB — I-STAT TROPONIN, ED: TROPONIN I, POC: 0.01 ng/mL (ref 0.00–0.08)

## 2014-05-10 MED ORDER — SULFAMETHOXAZOLE-TMP DS 800-160 MG PO TABS: 1.0000 | ORAL_TABLET | Freq: Two times a day (BID) | ORAL | Status: DC

## 2014-05-10 MED ORDER — SULFAMETHOXAZOLE-TMP DS 800-160 MG PO TABS
1.0000 | ORAL_TABLET | Freq: Once | ORAL | Status: AC
Start: 2014-05-10 — End: 2014-05-10
  Administered 2014-05-10: 1 via ORAL
  Filled 2014-05-10: qty 1

## 2014-05-10 NOTE — ED Notes (Signed)
Pt arrived to the ED via EMS from [facility] with a complaint of a fall.  Pt was found on the floor beside her bed by staff.  Per EMS patient was still on the floor covered in a blanket and with a pillow.  Pt has a history of dementia and alzheimer and dementia.  Pt herself denies any pain.  Range of motion is limited.

## 2014-05-10 NOTE — Discharge Instructions (Signed)
Bactrim for urinary tract infection. Follow up with primary care doctor.   Urinary Tract Infection Urinary tract infections (UTIs) can develop anywhere along your urinary tract. Your urinary tract is your body's drainage system for removing wastes and extra water. Your urinary tract includes two kidneys, two ureters, a bladder, and a urethra. Your kidneys are a pair of bean-shaped organs. Each kidney is about the size of your fist. They are located below your ribs, one on each side of your spine. CAUSES Infections are caused by microbes, which are microscopic organisms, including fungi, viruses, and bacteria. These organisms are so small that they can only be seen through a microscope. Bacteria are the microbes that most commonly cause UTIs. SYMPTOMS  Symptoms of UTIs may vary by age and gender of the patient and by the location of the infection. Symptoms in young women typically include a frequent and intense urge to urinate and a painful, burning feeling in the bladder or urethra during urination. Older women and men are more likely to be tired, shaky, and weak and have muscle aches and abdominal pain. A fever may mean the infection is in your kidneys. Other symptoms of a kidney infection include pain in your back or sides below the ribs, nausea, and vomiting. DIAGNOSIS To diagnose a UTI, your caregiver will ask you about your symptoms. Your caregiver also will ask to provide a urine sample. The urine sample will be tested for bacteria and white blood cells. White blood cells are made by your body to help fight infection. TREATMENT  Typically, UTIs can be treated with medication. Because most UTIs are caused by a bacterial infection, they usually can be treated with the use of antibiotics. The choice of antibiotic and length of treatment depend on your symptoms and the type of bacteria causing your infection. HOME CARE INSTRUCTIONS  If you were prescribed antibiotics, take them exactly as your  caregiver instructs you. Finish the medication even if you feel better after you have only taken some of the medication.  Drink enough water and fluids to keep your urine clear or pale yellow.  Avoid caffeine, tea, and carbonated beverages. They tend to irritate your bladder.  Empty your bladder often. Avoid holding urine for long periods of time.  Empty your bladder before and after sexual intercourse.  After a bowel movement, women should cleanse from front to back. Use each tissue only once. SEEK MEDICAL CARE IF:   You have back pain.  You develop a fever.  Your symptoms do not begin to resolve within 3 days. SEEK IMMEDIATE MEDICAL CARE IF:   You have severe back pain or lower abdominal pain.  You develop chills.  You have nausea or vomiting.  You have continued burning or discomfort with urination. MAKE SURE YOU:   Understand these instructions.  Will watch your condition.  Will get help right away if you are not doing well or get worse. Document Released: 05/12/2005 Document Revised: 02/01/2012 Document Reviewed: 09/10/2011 [facility] Patient Information 2015 [place], [place]. This information is not intended to replace advice given to you by your health care provider. Make sure you discuss any questions you have with your health care provider.  Fall Prevention and Home Safety Falls cause injuries and can affect all age groups. It is possible to use preventive measures to significantly decrease the likelihood of falls. There are many simple measures which can make your home safer and prevent falls. OUTDOORS  Repair cracks and edges of walkways and driveways.  Remove high doorway thresholds.  Trim shrubbery on the main path into your home.  Have good outside lighting.  Clear walkways of tools, rocks, debris, and clutter.  Check that handrails are not broken and are securely fastened. Both sides of steps should have handrails.  Have leaves, snow, and ice cleared  regularly.  Use sand or salt on walkways during winter months.  In the garage, clean up grease or oil spills. BATHROOM  Install night lights.  Install grab bars by the toilet and in the tub and shower.  Use non-skid mats or decals in the tub or shower.  Place a plastic non-slip stool in the shower to sit on, if needed.  Keep floors dry and clean up all water on the floor immediately.  Remove soap buildup in the tub or shower on a regular basis.  Secure bath mats with non-slip, double-sided rug tape.  Remove throw rugs and tripping hazards from the floors. BEDROOMS  Install night lights.  Make sure a bedside light is easy to reach.  Do not use oversized bedding.  Keep a telephone by your bedside.  Have a firm chair with side arms to use for getting dressed.  Remove throw rugs and tripping hazards from the floor. KITCHEN  Keep handles on pots and pans turned toward the center of the stove. Use back burners when possible.  Clean up spills quickly and allow time for drying.  Avoid walking on wet floors.  Avoid hot utensils and knives.  Position shelves so they are not too high or low.  Place commonly used objects within easy reach.  If necessary, use a sturdy step stool with a grab bar when reaching.  Keep electrical cables out of the way.  Do not use floor polish or wax that makes floors slippery. If you must use wax, use non-skid floor wax.  Remove throw rugs and tripping hazards from the floor. STAIRWAYS  Never leave objects on stairs.  Place handrails on both sides of stairways and use them. Fix any loose handrails. Make sure handrails on both sides of the stairways are as long as the stairs.  Check carpeting to make sure it is firmly attached along stairs. Make repairs to worn or loose carpet promptly.  Avoid placing throw rugs at the top or bottom of stairways, or properly secure the rug with carpet tape to prevent slippage. Get rid of throw rugs, if  possible.  Have an electrician put in a light switch at the top and bottom of the stairs. OTHER FALL PREVENTION TIPS  Wear low-heel or rubber-soled shoes that are supportive and fit well. Wear closed toe shoes.  When using a stepladder, make sure it is fully opened and both spreaders are firmly locked. Do not climb a closed stepladder.  Add color or contrast paint or tape to grab bars and handrails in your home. Place contrasting color strips on first and last steps.  Learn and use mobility aids as needed. Install an electrical emergency response system.  Turn on lights to avoid dark areas. Replace light bulbs that burn out immediately. Get light switches that glow.  Arrange furniture to create clear pathways. Keep furniture in the same place.  Firmly attach carpet with non-skid or double-sided tape.  Eliminate uneven floor surfaces.  Select a carpet pattern that does not visually hide the edge of steps.  Be aware of all pets. OTHER HOME SAFETY TIPS  Set the water temperature for 120 F (48.8 C).  Keep emergency numbers  on or near the telephone.  Keep smoke detectors on every level of the home and near sleeping areas. Document Released: 07/23/2002 Document Revised: 02/01/2012 Document Reviewed: 10/22/2011 [facility] Patient Information 2015 [place], [place]. This information is not intended to replace advice given to you by your health care provider. Make sure you discuss any questions you have with your health care provider.

## 2014-05-10 NOTE — ED Provider Notes (Signed)
Medical screening examination/treatment/procedure(s) were conducted as a shared visit with non-physician practitioner(s) and myself.  I personally evaluated the patient during the encounter.   EKG Interpretation None      Pt from SNF after unwittnessed fall. No signs of external trauma on exam, she is pleasantly demented, can follow commands and appears to have no focal neuro findings. CT head/c-spine nml. Urine infected. Will d/c w/ abx.   Toy Cookey, MD 05/10/14 2038

## 2014-05-10 NOTE — ED Notes (Signed)
Bed: ZO10 Expected date:  Expected time:  Means of arrival:  Comments: EMS fall - skin tear

## 2014-05-10 NOTE — ED Provider Notes (Signed)
CSN: [id number]     Arrival date & time 05/10/14  0224 History   First MD Initiated Contact with Patient 05/10/14 0239     Chief Complaint  Patient presents with  . Fall     (Consider location/radiation/quality/duration/timing/severity/associated sxs/prior Treatment) HPI Tanya Santana is a 80 y.o. female who presents to emergency department after a fall. Patient with history of dementia, hypertension, history of acute kidney failure,  Nonambulatory, lives at [facility], was found on the floor by nursing home staff. Patient is demented and unable to provide any history. According to EMS, when they arrived at the nursing home facility, patient was found on the floor with a blanket and pillow. It is unclear how long she has been on the floor but they don't think it has been too long since they saw her in the evening time and helped to bed. Patient was found to have a small skin tear to the left forearm. No other apparent injuries. Patient denies any pain. No recent illnesses.  Past Medical History  Diagnosis Date  . Hypertension   . Gout   . Acute kidney failure    History reviewed. No pertinent past surgical history. History reviewed. No pertinent family history. History  Substance Use Topics  . Smoking status: Former Games developer  . Smokeless tobacco: Not on file  . Alcohol Use: No   OB History   Grav Para Term Preterm Abortions TAB SAB Ect Mult Living                 Review of Systems  Unable to perform ROS: Dementia      Allergies  Cephalosporins; Macrolides and ketolides; Other; and Penicillins  Home Medications   Prior to Admission medications   Not on File   BP 119/51  Pulse 78  Temp(Src) 97.4 F (36.3 C) (Oral)  Resp 18  SpO2 95% Physical Exam  Nursing note and vitals reviewed. Constitutional: She appears well-developed and well-nourished. No distress.  HENT:  Head: Normocephalic and atraumatic.  Right Ear: External ear normal.  Left Ear: External ear  normal.  Nose: Nose normal.  Mouth/Throat: Oropharynx is clear and moist.  Eyes: Conjunctivae and EOM are normal. Pupils are equal, round, and reactive to light.  Neck: Normal range of motion. Neck supple.  Cardiovascular: Normal rate, regular rhythm and normal heart sounds.   Pulmonary/Chest: Effort normal and breath sounds normal. No respiratory distress. She has no wheezes. She has no rales.  Abdominal: Soft. Bowel sounds are normal. She exhibits no distension. There is no tenderness. There is no rebound.  Musculoskeletal: She exhibits no edema.  No significant tenderness to the cervical, thoracic, lumbar spine. Pelvis nontender. Limited her range of motion of bilateral hips and knees, there does not appear to be any pain with range of motion however. Full range of motion of bilateral shoulders, elbows, wrists with the pain.  Neurological: She is alert.  Answers questions but confused. Disoriented. Follows simple directions.  Skin: Skin is warm and dry.  Small skin tear to the left forearm.  Psychiatric: She has a normal mood and affect. Her behavior is normal.    ED Course  Procedures (including critical care time) Labs Review Labs Reviewed  COMPREHENSIVE METABOLIC PANEL - Abnormal; Notable for the following:    Albumin 2.6 (*)    Alkaline Phosphatase 152 (*)    GFR calc non Af Amer 59 (*)    GFR calc Af Amer 68 (*)    All other components within  normal limits  URINALYSIS, ROUTINE W REFLEX MICROSCOPIC - Abnormal; Notable for the following:    APPearance CLOUDY (*)    Nitrite POSITIVE (*)    Leukocytes, UA SMALL (*)    All other components within normal limits  URINE MICROSCOPIC-ADD ON - Abnormal; Notable for the following:    Bacteria, UA MANY (*)    All other components within normal limits  URINE CULTURE  CBC WITH DIFFERENTIAL  CK  I-STAT TROPOININ, ED    Imaging Review Dg Pelvis 1-2 Views  05/10/2014   CLINICAL DATA:  Found down.  Limited motion.  EXAM: PELVIS - 1-2  VIEW  COMPARISON:  None.  FINDINGS: No acute fracture deformity, femoral heads are located. Remote RIGHT superior inferior pubic rami fractures. Bone mineral density is decreased without destructive bony lesions. Moderate bilateral hip osteoarthrosis. Periarticular soft tissue planes are nonsuspicious.  IMPRESSION: No acute fracture deformity or dislocation ; patient is osteopenic which decreases sensitivity for acute nondisplaced fractures.   Electronically Signed   By: Awilda Metro   On: 05/10/2014 03:52   Ct Head Wo Contrast  05/10/2014   CLINICAL DATA:  Found on floor by the head.  Alzheimer's dementia.  EXAM: CT HEAD WITHOUT CONTRAST  CT CERVICAL SPINE WITHOUT CONTRAST  TECHNIQUE: Multidetector CT imaging of the head and cervical spine was performed following the standard protocol without intravenous contrast. Multiplanar CT image reconstructions of the cervical spine were also generated.  COMPARISON:  None.  FINDINGS: CT HEAD FINDINGS  Skull and Sinuses:Negative for fracture or destructive process. The mastoids, middle ears, and imaged paranasal sinuses are clear.  Orbits: Bilateral cataract resection.  Brain: No evidence of acute abnormality, such as acute infarction, hemorrhage, hydrocephalus, or mass lesion/mass effect. There is advanced generalized brain atrophy with ex vacuo ventricular enlargement. Advanced small vessel ischemic change with gliosis confluent throughout the bilateral cerebral white matter.  CT CERVICAL SPINE FINDINGS  Negative for acute fracture. There is 2 mm of C4-5 anterolisthesis which is associated with facet osteoarthritis, the most likely cause if there is concerning exam findings at clinical cervical spine clearance, this could be further evaluated with flexion extension radiography or cervical spine MRI. The uncovered disc at this level mildly bulges. There is no prevertebral swelling or gross cervical canal hematoma. Degenerative changes are diffuse, without evidence of  high-grade canal stenosis.  Incidental findings include a multi nodular goiter and biapical emphysema.  IMPRESSION: 1. No acute intracranial injury 2. No cervical spine fracture. Mild C4-5 anterolisthesis which is likely degenerative, as discussed above. 3. Advanced brain atrophy and chronic small vessel disease.   Electronically Signed   By: Tiburcio Pea M.D.   On: 05/10/2014 04:06   Ct Cervical Spine Wo Contrast  05/10/2014   CLINICAL DATA:  Found on floor by the head.  Alzheimer's dementia.  EXAM: CT HEAD WITHOUT CONTRAST  CT CERVICAL SPINE WITHOUT CONTRAST  TECHNIQUE: Multidetector CT imaging of the head and cervical spine was performed following the standard protocol without intravenous contrast. Multiplanar CT image reconstructions of the cervical spine were also generated.  COMPARISON:  None.  FINDINGS: CT HEAD FINDINGS  Skull and Sinuses:Negative for fracture or destructive process. The mastoids, middle ears, and imaged paranasal sinuses are clear.  Orbits: Bilateral cataract resection.  Brain: No evidence of acute abnormality, such as acute infarction, hemorrhage, hydrocephalus, or mass lesion/mass effect. There is advanced generalized brain atrophy with ex vacuo ventricular enlargement. Advanced small vessel ischemic change with gliosis confluent throughout the bilateral cerebral  white matter.  CT CERVICAL SPINE FINDINGS  Negative for acute fracture. There is 2 mm of C4-5 anterolisthesis which is associated with facet osteoarthritis, the most likely cause if there is concerning exam findings at clinical cervical spine clearance, this could be further evaluated with flexion extension radiography or cervical spine MRI. The uncovered disc at this level mildly bulges. There is no prevertebral swelling or gross cervical canal hematoma. Degenerative changes are diffuse, without evidence of high-grade canal stenosis.  Incidental findings include a multi nodular goiter and biapical emphysema.  IMPRESSION: 1.  No acute intracranial injury 2. No cervical spine fracture. Mild C4-5 anterolisthesis which is likely degenerative, as discussed above. 3. Advanced brain atrophy and chronic small vessel disease.   Electronically Signed   By: Tiburcio Pea M.D.   On: 05/10/2014 04:06     EKG Interpretation None      MDM   Final diagnoses:  Fall, initial encounter  UTI (lower urinary tract infection)   Patient is here after a fall, found on the floor next her bed. Patient is nonambulatory. Most likely mechanical fall, however fall was unwitnessed. Will check some labs, EKG, CT head and cervical spine, pelvic x-ray, urinalysis.   5:02 AM X-rays and labs unremarkable. UA infected. Will treat. Allergic to cephalosporins and penicillins. Will start on bactrim. Urine cultures sent. D/c back to SNIF.   Filed Vitals:   05/10/14 0224 05/10/14 0230  BP: 130/80 119/51  Pulse: 88 78  Temp:  97.4 F (36.3 C)  TempSrc:  Oral  Resp:  18  SpO2:  95%       Myriam Jacobson Earlie Schank, PA-C 05/10/14 0543

## 2014-05-14 LAB — URINE CULTURE: Colony Count: >=100000

## 2014-05-15 ENCOUNTER — Telehealth (HOSPITAL_COMMUNITY): Payer: Self-pay | Admitting: *Deleted

## 2014-05-15 NOTE — ED Notes (Signed)
(+)  urine culture, treated with Sulfamethoxazole, [facility]

## 2014-06-22 ENCOUNTER — Encounter (HOSPITAL_COMMUNITY): Payer: Self-pay | Admitting: Emergency Medicine

## 2014-06-22 ENCOUNTER — Emergency Department (HOSPITAL_COMMUNITY)
Admission: EM | Admit: 2014-06-22 | Discharge: 2014-06-22 | Disposition: A | Discharge status: Home / Self Care | Payer: Medicare Other | Attending: Emergency Medicine | Admitting: Emergency Medicine

## 2014-06-22 DIAGNOSIS — F0390 Unspecified dementia without behavioral disturbance: Secondary | ICD-10-CM

## 2014-06-22 DIAGNOSIS — Z8701 Personal history of pneumonia (recurrent): Secondary | ICD-10-CM | POA: Insufficient documentation

## 2014-06-22 DIAGNOSIS — Z79899 Other long term (current) drug therapy: Secondary | ICD-10-CM | POA: Insufficient documentation

## 2014-06-22 DIAGNOSIS — Z8719 Personal history of other diseases of the digestive system: Secondary | ICD-10-CM | POA: Insufficient documentation

## 2014-06-22 DIAGNOSIS — Z87891 Personal history of nicotine dependence: Secondary | ICD-10-CM | POA: Insufficient documentation

## 2014-06-22 DIAGNOSIS — Y9289 Other specified places as the place of occurrence of the external cause: Secondary | ICD-10-CM | POA: Insufficient documentation

## 2014-06-22 DIAGNOSIS — N189 Chronic kidney disease, unspecified: Secondary | ICD-10-CM | POA: Insufficient documentation

## 2014-06-22 DIAGNOSIS — Z880 Allergy status to penicillin: Secondary | ICD-10-CM | POA: Insufficient documentation

## 2014-06-22 DIAGNOSIS — F0280 Dementia in other diseases classified elsewhere without behavioral disturbance: Secondary | ICD-10-CM | POA: Insufficient documentation

## 2014-06-22 DIAGNOSIS — Z043 Encounter for examination and observation following other accident: Secondary | ICD-10-CM | POA: Insufficient documentation

## 2014-06-22 DIAGNOSIS — G309 Alzheimer's disease, unspecified: Secondary | ICD-10-CM | POA: Insufficient documentation

## 2014-06-22 DIAGNOSIS — W06XXXA Fall from bed, initial encounter: Secondary | ICD-10-CM | POA: Insufficient documentation

## 2014-06-22 DIAGNOSIS — Z8739 Personal history of other diseases of the musculoskeletal system and connective tissue: Secondary | ICD-10-CM | POA: Insufficient documentation

## 2014-06-22 DIAGNOSIS — Y92122 Bedroom in nursing home as the place of occurrence of the external cause: Secondary | ICD-10-CM | POA: Insufficient documentation

## 2014-06-22 DIAGNOSIS — I129 Hypertensive chronic kidney disease with stage 1 through stage 4 chronic kidney disease, or unspecified chronic kidney disease: Secondary | ICD-10-CM | POA: Insufficient documentation

## 2014-06-22 NOTE — ED Notes (Signed)
[facility]. Per EMS- nurse sts that pt was seated beside her bed this am. Pt has no obvious injuries. Pt passed spinal and neck exam on scene. Pt has hx of dementia. Pt in NAD

## 2014-06-22 NOTE — ED Notes (Signed)
Bed: ZO10WA11 Expected date: 06/22/14 Expected time: 8:42 AM Means of arrival: Ambulance Comments: Fall/ Nursing home/ Dementia

## 2014-06-22 NOTE — ED Notes (Signed)
Pt family member requested to know if pt had testing. Pt family member was advised that Dr. Denton LankSteinl had performed a physical exam and has cleared pt for d/c to return to facility. Pt family member had this writer talk to her sibling and sts the same. Family members were reassured that pt had received physical exam and cleared. Dr. Denton LankSteinl notified.

## 2014-06-22 NOTE — ED Provider Notes (Signed)
CSN: [id number]     Arrival date & time 06/22/14  [id number] History   First MD Initiated Contact with Patient 06/22/14 0848     Chief Complaint  Patient presents with  . Fall     (Consider location/radiation/quality/duration/timing/severity/associated sxs/prior Treatment) Patient is a 80 y.o. female presenting with fall. The history is provided by the patient and the EMS personnel. The history is limited by the condition of the patient.  Fall  pt w hx advanced dementia, from ecf, pt found seated aside of bed this morning. No loc noted. pts mental status noted c/w baseline per ems and staff at ecf.  Pt w advanced dementia - level 5 caveat.      Past Medical History  Diagnosis Date  . Alzheimer's dementia   . Pneumonia   . Tachycardia   . Chronic renal insufficiency   . Hernia   . Hypertension   . Gout   . Acute kidney failure    Past Surgical History  Procedure Laterality Date  . Cholecystectomy    . Abdominal hysterectomy    . Colon surgery    . Stomach surgery     No family history on file. History  Substance Use Topics  . Smoking status: Former Games developermoker  . Smokeless tobacco: Not on file  . Alcohol Use: No   OB History    Gravida Para Term Preterm AB TAB SAB Ectopic Multiple Living            3         Review of Systems  Unable to perform ROS: Dementia  level 5 caveat      Allergies  Cephalosporins; Cephalosporins; Macrolides and ketolides; Macrolides and ketolides; Other; Penicillins; and Penicillins  Home Medications   Prior to Admission medications   Medication Sig Start Date End Date Taking? Authorizing Provider  acetaminophen (TYLENOL) 500 MG tablet Take 500 mg by mouth every 4 (four) hours as needed for pain or fever.     Historical Provider, MD  acetaminophen (TYLENOL) 500 MG tablet Take 500 mg by mouth every 6 (six) hours as needed for headache.    Historical Provider, MD  acidophilus (RISAQUAD) CAPS capsule Take 1 capsule by mouth daily.   08/06/13   Historical Provider, MD  acidophilus (RISAQUAD) CAPS capsule Take 1 capsule by mouth daily.    Historical Provider, MD  alum & mag hydroxide-simeth (GERI-LANTA) 200-200-20 MG/5ML suspension Take 30 mLs by mouth 4 (four) times daily as needed for indigestion or heartburn.    Historical Provider, MD  alum & mag hydroxide-simeth (MAALOX/MYLANTA) 200-200-20 MG/5ML suspension Take 30 mLs by mouth every 6 (six) hours as needed for indigestion or heartburn.    Historical Provider, MD  amLODipine (NORVASC) 5 MG tablet Take 5 mg by mouth every morning.     Historical Provider, MD  amLODipine (NORVASC) 5 MG tablet Take 5 mg by mouth daily.    Historical Provider, MD  Artificial Tear Ointment (REFRESH LACRI-LUBE OP) Apply 1 application to eye 2 (two) times daily.    Historical Provider, MD  Artificial Tear Ointment (REFRESH LACRI-LUBE) OINT Place 1 strip into both eyes 2 (two) times daily.    Historical Provider, MD  calcium carbonate (OS-CAL) 600 MG TABS tablet Take 600 mg by mouth daily.    Historical Provider, MD  calcium carbonate (OS-CAL) 600 MG TABS Take 600 mg by mouth every morning.     Historical Provider, MD  Cholecalciferol (VITAMIN D-3) 5000 UNITS TABS Take 1 capsule by  mouth daily.    Historical Provider, MD  Cholecalciferol (VITAMIN D3) 5000 UNITS TABS Take 5,000 Units by mouth daily with breakfast.     Historical Provider, MD  Cranberry 450 MG CAPS Take 1 capsule by mouth 2 (two) times daily.    Historical Provider, MD  Cranberry-Vitamin C-Probiotic (AZO CRANBERRY PO) Take 1 tablet by mouth 2 (two) times daily.    Historical Provider, MD  divalproex (DEPAKOTE SPRINKLE) 125 MG capsule Take 250 mg by mouth 4 (four) times daily.    Historical Provider, MD  divalproex (DEPAKOTE SPRINKLE) 125 MG capsule Take 250 mg by mouth 4 (four) times daily.    Historical Provider, MD  FLUoxetine (PROZAC) 10 MG capsule Take 10 mg by mouth daily. Take with 1 capsule orally every morning with 20 mg to  equal  30 mg.    Historical Provider, MD  FLUoxetine (PROZAC) 10 MG capsule Take 10 mg by mouth daily. Add to 20mg  fluoxetine to equal 30mg  daily    Historical Provider, MD  FLUoxetine (PROZAC) 20 MG capsule Take 20 mg by mouth every morning. Take 1 capsule every morning with 10 mg to equal 30 mg.    Historical Provider, MD  FLUoxetine (PROZAC) 20 MG capsule Take 20 mg by mouth daily. Take along with 10mg  to equal 30mg     Historical Provider, MD  guaifenesin (Q-TUSSIN) 100 MG/5ML syrup Take 200 mg by mouth every 6 (six) hours as needed for cough.    Historical Provider, MD  HYDROcodone-acetaminophen (NORCO/VICODIN) 5-325 MG per tablet Take 1 tablet by mouth every 8 (eight) hours as needed for moderate pain.    Historical Provider, MD  HYDROcodone-acetaminophen (NORCO/VICODIN) 5-325 MG per tablet Take 1 tablet by mouth every 8 (eight) hours as needed for moderate pain.    Historical Provider, MD  hydroxypropyl methylcellulose (ISOPTO TEARS) 2.5 % ophthalmic solution Place 2 drops into both eyes 3 (three) times daily as needed for dry eyes.    Historical Provider, MD  hydroxypropyl methylcellulose / hypromellose (ISOPTO TEARS / GONIOVISC) 2.5 % ophthalmic solution Place 2 drops into both eyes 3 (three) times daily.    Historical Provider, MD  loperamide (IMODIUM A-D) 2 MG tablet Take 2 mg by mouth as needed for diarrhea or loose stools.     Historical Provider, MD  loperamide (IMODIUM) 2 MG capsule Take 2 mg by mouth as needed for diarrhea or loose stools.    Historical Provider, MD  loratadine (CLARITIN) 10 MG tablet Take 10 mg by mouth daily after breakfast.    Historical Provider, MD  loratadine (CLARITIN) 10 MG tablet Take 10 mg by mouth daily.    Historical Provider, MD  LORazepam (ATIVAN) 0.5 MG tablet Take 0.5 mg by mouth every 6 (six) hours as needed (for agitation).    Historical Provider, MD  LORazepam (ATIVAN) 0.5 MG tablet Take 0.5 mg by mouth every 6 (six) hours as needed for anxiety.     Historical Provider, MD  magnesium hydroxide (MILK OF MAGNESIA) 400 MG/5ML suspension Take 30 mLs by mouth at bedtime as needed for constipation.     Historical Provider, MD  Memantine HCl ER (NAMENDA XR) 28 MG CP24 Take 1 capsule by mouth daily.    Historical Provider, MD  Memantine HCl ER (NAMENDA XR) 28 MG CP24 Take 1 capsule by mouth daily.    Historical Provider, MD  Menthol-Zinc Oxide (RISAMINE) 0.44-20.625 % OINT Apply 1 application topically as needed (for incontinence care).    Historical Provider, MD  Menthol-Zinc Oxide (RISAMINE) 0.44-20.625 % OINT Apply 1 application topically as needed (incontinence care).    Historical Provider, MD  mirtazapine (REMERON) 30 MG tablet Take 30 mg by mouth at bedtime.    Historical Provider, MD  mirtazapine (REMERON) 30 MG tablet Take 30 mg by mouth at bedtime.    Historical Provider, MD  Neomycin-Bacitracin-Polymyxin (TRIPLE ANTIBIOTIC) [phone] OINT Apply 1 application topically as needed (for wound care).    Historical Provider, MD  NUTRITIONAL SUPPLEMENT LIQD Take 1 Bottle by mouth 3 (three) times daily with meals. Mighty Shakes    Historical Provider, MD  Nutritional Supplements (NUTRITIONAL SUPPLEMENT PO) Take 1 Can by mouth 3 (three) times daily with meals. Mighty shakes    Historical Provider, MD  nystatin (MYCOSTATIN/NYSTOP) 100000 UNIT/GM POWD Apply 1 g topically 2 (two) times daily.    Historical Provider, MD  nystatin (MYCOSTATIN/NYSTOP) 100000 UNIT/GM POWD Apply 1 g topically 2 (two) times daily. To feet and interdigital spaces    Historical Provider, MD  OLANZapine (ZYPREXA) 10 MG tablet Take 5 mg by mouth 2 (two) times daily.    Historical Provider, MD  OLANZapine (ZYPREXA) 10 MG tablet Take 5 mg by mouth 2 (two) times daily.    Historical Provider, MD  omeprazole (PRILOSEC) 20 MG capsule Take 20 mg by mouth 2 (two) times daily. At 0800 an 1600    Historical Provider, MD  omeprazole (PRILOSEC) 20 MG capsule Take 20 mg by mouth 2 (two)  times daily before a meal.    Historical Provider, MD  Ostomy Supplies (SKIN PREP SPRAY) MISC Apply 1 application topically as needed (For wound care). Apply to left heel as needed for wound care    Historical Provider, MD  OVER THE COUNTER MEDICATION Take 1 application by mouth as needed (until wound is gone).    Historical Provider, MD  potassium chloride SA (K-DUR,KLOR-CON) 20 MEQ tablet Take 20 mEq by mouth every morning.     Historical Provider, MD  potassium chloride SA (K-DUR,KLOR-CON) 20 MEQ tablet Take 20 mEq by mouth daily.    Historical Provider, MD  PRESCRIPTION MEDICATION Apply 1 application topically daily. Geri-Sleeve: Use for upper left arm for skin tear    Historical Provider, MD  rivastigmine (EXELON) 9.5 mg/24hr Place 9.5 mg onto the skin daily.    Historical Provider, MD  rivastigmine (EXELON) 9.5 mg/24hr Place 9.5 mg onto the skin daily.    Historical Provider, MD  sulfamethoxazole-trimethoprim (BACTRIM DS) 800-160 MG per tablet Take 1 tablet by mouth 2 (two) times daily. 05/10/14   [name], PA-C  triamcinolone cream (KENALOG) 0.1 % Apply 1 application topically 2 (two) times daily as needed (for facial redness).    Historical Provider, MD  triamcinolone cream (KENALOG) 0.1 % Apply 1 application topically 2 (two) times daily as needed (for irritated skin).    Historical Provider, MD  vitamin B-12 (CYANOCOBALAMIN) 1000 MCG tablet Take 1,000 mcg by mouth every morning.     Historical Provider, MD  vitamin B-12 (CYANOCOBALAMIN) 1000 MCG tablet Take 1,000 mcg by mouth daily.    Historical Provider, MD  Wound Dressings (MEDIHONEY WOUND/BURN DRESSING) GEL Apply 1 application topically 3 (three) times a week. As needed for wound care    Historical Provider, MD   BP 153/81 mmHg  Pulse 81  Temp(Src) 98.1 F (36.7 C) (Oral)  Resp 16  SpO2 100% Physical Exam  Constitutional: She appears well-developed and well-nourished. No distress.  HENT:  Head: Atraumatic.  Nose:  Nose  normal.  Mouth/Throat: Oropharynx is clear and moist.  No facial or scalp bruising, sts, or tenderness.   Eyes: Conjunctivae are normal. Pupils are equal, round, and reactive to light. No scleral icterus.  Neck: Normal range of motion. Neck supple. No tracheal deviation present.  Cardiovascular: Normal rate, regular rhythm, normal heart sounds and intact distal pulses.   Pulmonary/Chest: Effort normal and breath sounds normal. No respiratory distress.  Abdominal: Soft. Normal appearance and bowel sounds are normal. She exhibits no distension. There is no tenderness.  Genitourinary:  No cva tenderness  Musculoskeletal: Normal range of motion. She exhibits no edema or tenderness.  CTLS spine, non tender, aligned, no step off. Good rom bil extremities without pain or focal bony tenderness. Distal pulses palp.   Neurological: She is alert. She displays normal reflexes.  Pt alert, content. Moves bil ext purposefully with good strength. Pt confused, periods of calling out and becomes much calmer when in room and talking to pt - pts mental status/behavrior described as being c/w baseline.  Skin: Skin is warm and dry. No rash noted. She is not diaphoretic.  Psychiatric:  Alert, content, confused.   Nursing note and vitals reviewed.   ED Course  Procedures (including critical care time) Labs Review   MDM  Staff/ems indicate pts mental status c/w her baseline.  On recheck, pt calm and alert. No apparent pain or discomfort.  Reviewed nursing notes and prior charts for additional history.   Pt appears stable for d/c.      Suzi Roots, MD 06/22/14 [phone]

## 2014-06-22 NOTE — Discharge Instructions (Signed)
Fall precautions.  Return to ER if worse, new symptoms, fevers, other concern.     Fall Prevention and Home Safety Falls cause injuries and can affect all age groups. It is possible to use preventive measures to significantly decrease the likelihood of falls. There are many simple measures which can make your home safer and prevent falls. OUTDOORS  Repair cracks and edges of walkways and driveways.  Remove high doorway thresholds.  Trim shrubbery on the main path into your home.  Have good outside lighting.  Clear walkways of tools, rocks, debris, and clutter.  Check that handrails are not broken and are securely fastened. Both sides of steps should have handrails.  Have leaves, snow, and ice cleared regularly.  Use sand or salt on walkways during winter months.  In the garage, clean up grease or oil spills. BATHROOM  Install night lights.  Install grab bars by the toilet and in the tub and shower.  Use non-skid mats or decals in the tub or shower.  Place a plastic non-slip stool in the shower to sit on, if needed.  Keep floors dry and clean up all water on the floor immediately.  Remove soap buildup in the tub or shower on a regular basis.  Secure bath mats with non-slip, double-sided rug tape.  Remove throw rugs and tripping hazards from the floors. BEDROOMS  Install night lights.  Make sure a bedside light is easy to reach.  Do not use oversized bedding.  Keep a telephone by your bedside.  Have a firm chair with side arms to use for getting dressed.  Remove throw rugs and tripping hazards from the floor. KITCHEN  Keep handles on pots and pans turned toward the center of the stove. Use back burners when possible.  Clean up spills quickly and allow time for drying.  Avoid walking on wet floors.  Avoid hot utensils and knives.  Position shelves so they are not too high or low.  Place commonly used objects within easy reach.  If necessary, use a  sturdy step stool with a grab bar when reaching.  Keep electrical cables out of the way.  Do not use floor polish or wax that makes floors slippery. If you must use wax, use non-skid floor wax.  Remove throw rugs and tripping hazards from the floor. STAIRWAYS  Never leave objects on stairs.  Place handrails on both sides of stairways and use them. Fix any loose handrails. Make sure handrails on both sides of the stairways are as long as the stairs.  Check carpeting to make sure it is firmly attached along stairs. Make repairs to worn or loose carpet promptly.  Avoid placing throw rugs at the top or bottom of stairways, or properly secure the rug with carpet tape to prevent slippage. Get rid of throw rugs, if possible.  Have an electrician put in a light switch at the top and bottom of the stairs. OTHER FALL PREVENTION TIPS  Wear low-heel or rubber-soled shoes that are supportive and fit well. Wear closed toe shoes.  When using a stepladder, make sure it is fully opened and both spreaders are firmly locked. Do not climb a closed stepladder.  Add color or contrast paint or tape to grab bars and handrails in your home. Place contrasting color strips on first and last steps.  Learn and use mobility aids as needed. Install an electrical emergency response system.  Turn on lights to avoid dark areas. Replace light bulbs that burn out immediately. Get  light switches that glow.  Arrange furniture to create clear pathways. Keep furniture in the same place.  Firmly attach carpet with non-skid or double-sided tape.  Eliminate uneven floor surfaces.  Select a carpet pattern that does not visually hide the edge of steps.  Be aware of all pets. OTHER HOME SAFETY TIPS  Set the water temperature for 120 F (48.8 C).  Keep emergency numbers on or near the telephone.  Keep smoke detectors on every level of the home and near sleeping areas. Document Released: 07/23/2002 Document Revised:  02/01/2012 Document Reviewed: 10/22/2011 [facility] Patient Information 2015 [place], [place]. This information is not intended to replace advice given to you by your health care provider. Make sure you discuss any questions you have with your health care provider.       Dementia Dementia is a general term for problems with brain function. A person with dementia has memory loss and a hard time with at least one other brain function such as thinking, speaking, or problem solving. Dementia can affect social functioning, how you do your job, your mood, or your personality. The changes may be hidden for a long time. The earliest forms of this disease are usually not detected by family or friends. Dementia can be:  Irreversible.  Potentially reversible.  Partially reversible.  Progressive. This means it can get worse over time. CAUSES  Irreversible dementia causes may include:  Degeneration of brain cells (Alzheimer disease or Lewy body dementia).  Multiple small strokes (vascular dementia).  Infection (chronic meningitis or Creutzfeldt-Jakob disease).  Frontotemporal dementia. This affects younger people, age 78 to 1470, compared to those who have Alzheimer disease.  Dementia associated with other disorders like Parkinson disease, Huntington disease, or HIV-associated dementia. Potentially or partially reversible dementia causes may include:  Medicines.  Metabolic causes such as excessive alcohol intake, vitamin B12 deficiency, or thyroid disease.  Masses or pressure in the brain such as a tumor, blood clot, or hydrocephalus. SIGNS AND SYMPTOMS  Symptoms are often hard to detect. Family members or coworkers may not notice them early in the disease process. Different people with dementia may have different symptoms. Symptoms can include:  A hard time with memory, especially recent memory. Long-term memory may not be impaired.  Asking the same question multiple times or forgetting  something someone just said.  A hard time speaking your thoughts or finding certain words.  A hard time solving problems or performing familiar tasks (such as how to use a telephone).  Sudden changes in mood.  Changes in personality, especially increasing moodiness or mistrust.  Depression.  A hard time understanding complex ideas that were never a problem in the past. DIAGNOSIS  There are no specific tests for dementia.   Your health care provider may recommend a thorough evaluation. This is because some forms of dementia can be reversible. The evaluation will likely include a physical exam and getting a detailed history from you and a family member. The history often gives the best clues and suggestions for a diagnosis.  Memory testing may be done. A detailed brain function evaluation called neuropsychologic testing may be helpful.  Lab tests and brain imaging (such as a CT scan or MRI scan) are sometimes important.  Sometimes observation and re-evaluation over time is very helpful. TREATMENT  Treatment depends on the cause.   If the problem is a vitamin deficiency, it may be helped or cured with supplements.  For dementias such as Alzheimer disease, medicines are available to stabilize or  slow the course of the disease. There are no cures for this type of dementia.  Your health care provider can help direct you to groups, organizations, and other health care providers to help with decisions in the care of you or your loved one. HOME CARE INSTRUCTIONS The care of individuals with dementia is varied and dependent upon the progression of the dementia. The following suggestions are intended for the person living with, or caring for, the person with dementia.  Create a safe environment.  Remove the locks on bathroom doors to prevent the person from accidentally locking himself or herself in.  Use childproof latches on kitchen cabinets and any place where cleaning supplies,  chemicals, or alcohol are kept.  Use childproof covers in unused electrical outlets.  Install childproof devices to keep doors and windows secured.  Remove stove knobs or install safety knobs and an automatic shut-off on the stove.  Lower the temperature on water heaters.  Label medicines and keep them locked up.  Secure knives, lighters, matches, power tools, and guns, and keep these items out of reach.  Keep the house free from clutter. Remove rugs or anything that might contribute to a fall.  Remove objects that might break and hurt the person.  Make sure lighting is good, both inside and outside.  Install grab rails as needed.  Use a monitoring device to alert you to falls or other needs for help.  Reduce confusion.  Keep familiar objects and people around.  Use night lights or dim lights at night.  Label items or areas.  Use reminders, notes, or directions for daily activities or tasks.  Keep a simple, consistent routine for waking, meals, bathing, dressing, and bedtime.  Create a calm, quiet environment.  Place large clocks and calendars prominently.  Display emergency numbers and home address near all telephones.  Use cues to establish different times of the day. An example is to open curtains to let the natural light in during the day.   Use effective communication.  Choose simple words and short sentences.  Use a gentle, calm tone of voice.  Be careful not to interrupt.  If the person is struggling to find a word or communicate a thought, try to provide the word or thought.  Ask one question at a time. Allow the person ample time to answer questions. Repeat the question again if the person does not respond.  Reduce nighttime restlessness.  Provide a comfortable bed.  Have a consistent nighttime routine.  Ensure a regular walking or physical activity schedule. Involve the person in daily activities as much as possible.  Limit napping during the  day.  Limit caffeine.  Attend social events that stimulate rather than overwhelm the senses.  Encourage good nutrition and hydration.  Reduce distractions during meal times and snacks.  Avoid foods that are too hot or too cold.  Monitor chewing and swallowing ability.  Continue with routine vision, hearing, dental, and medical screenings.  Give medicines only as directed by the health care provider.  Monitor driving abilities. Do not allow the person to drive when safe driving is no longer possible.  Register with an identification program which could provide location assistance in the event of a missing person situation. SEEK MEDICAL CARE IF:   New behavioral problems start such as moodiness, aggressiveness, or seeing things that are not there (hallucinations).  Any new problem with brain function happens. This includes problems with balance, speech, or falling a lot.  Problems with swallowing  develop.  Any symptoms of other illness happen. Small changes or worsening in any aspect of brain function can be a sign that the illness is getting worse. It can also be a sign of another medical illness such as infection. Seeing a health care provider right away is important. SEEK IMMEDIATE MEDICAL CARE IF:   A fever develops.  New or worsened confusion develops.  New or worsened sleepiness develops.  Staying awake becomes hard to do. Document Released: 01/26/2001 Document Revised: 12/17/2013 Document Reviewed: 12/28/2010 [facility] Patient Information 2015 [place], [place]. This information is not intended to replace advice given to you by your health care provider. Make sure you discuss any questions you have with your health care provider.

## 2014-06-22 NOTE — ED Notes (Signed)
[facility]

## 2014-09-14 ENCOUNTER — Encounter (HOSPITAL_COMMUNITY): Payer: Self-pay | Admitting: Emergency Medicine

## 2014-09-14 ENCOUNTER — Inpatient Hospital Stay (HOSPITAL_COMMUNITY)
Admission: EM | Admit: 2014-09-14 | Discharge: 2014-09-16 | DRG: 871 | Disposition: E | Payer: Medicare Other | Attending: Internal Medicine | Admitting: Internal Medicine

## 2014-09-14 ENCOUNTER — Emergency Department (HOSPITAL_COMMUNITY): Payer: Medicare Other

## 2014-09-14 DIAGNOSIS — Z880 Allergy status to penicillin: Secondary | ICD-10-CM | POA: Diagnosis not present

## 2014-09-14 DIAGNOSIS — N390 Urinary tract infection, site not specified: Secondary | ICD-10-CM

## 2014-09-14 DIAGNOSIS — E860 Dehydration: Secondary | ICD-10-CM | POA: Diagnosis present

## 2014-09-14 DIAGNOSIS — A419 Sepsis, unspecified organism: Secondary | ICD-10-CM | POA: Diagnosis present

## 2014-09-14 DIAGNOSIS — N179 Acute kidney failure, unspecified: Secondary | ICD-10-CM | POA: Diagnosis present

## 2014-09-14 DIAGNOSIS — R6521 Severe sepsis with septic shock: Secondary | ICD-10-CM | POA: Diagnosis present

## 2014-09-14 DIAGNOSIS — Y95 Nosocomial condition: Secondary | ICD-10-CM | POA: Diagnosis present

## 2014-09-14 DIAGNOSIS — N189 Chronic kidney disease, unspecified: Secondary | ICD-10-CM | POA: Diagnosis present

## 2014-09-14 DIAGNOSIS — J690 Pneumonitis due to inhalation of food and vomit: Secondary | ICD-10-CM | POA: Diagnosis present

## 2014-09-14 DIAGNOSIS — G309 Alzheimer's disease, unspecified: Secondary | ICD-10-CM | POA: Diagnosis present

## 2014-09-14 DIAGNOSIS — Z515 Encounter for palliative care: Secondary | ICD-10-CM

## 2014-09-14 DIAGNOSIS — F0280 Dementia in other diseases classified elsewhere without behavioral disturbance: Secondary | ICD-10-CM | POA: Diagnosis present

## 2014-09-14 DIAGNOSIS — Z66 Do not resuscitate: Secondary | ICD-10-CM | POA: Diagnosis present

## 2014-09-14 DIAGNOSIS — E870 Hyperosmolality and hypernatremia: Secondary | ICD-10-CM

## 2014-09-14 DIAGNOSIS — I129 Hypertensive chronic kidney disease with stage 1 through stage 4 chronic kidney disease, or unspecified chronic kidney disease: Secondary | ICD-10-CM | POA: Diagnosis present

## 2014-09-14 DIAGNOSIS — Z87891 Personal history of nicotine dependence: Secondary | ICD-10-CM

## 2014-09-14 DIAGNOSIS — I10 Essential (primary) hypertension: Secondary | ICD-10-CM

## 2014-09-14 DIAGNOSIS — R748 Abnormal levels of other serum enzymes: Secondary | ICD-10-CM | POA: Diagnosis present

## 2014-09-14 DIAGNOSIS — R0602 Shortness of breath: Secondary | ICD-10-CM | POA: Diagnosis not present

## 2014-09-14 DIAGNOSIS — J9601 Acute respiratory failure with hypoxia: Secondary | ICD-10-CM

## 2014-09-14 DIAGNOSIS — J189 Pneumonia, unspecified organism: Secondary | ICD-10-CM | POA: Diagnosis present

## 2014-09-14 DIAGNOSIS — Z881 Allergy status to other antibiotic agents status: Secondary | ICD-10-CM

## 2014-09-14 LAB — I-STAT TROPONIN, ED: TROPONIN I, POC: 0.12 ng/mL — AB (ref 0.00–0.08)

## 2014-09-14 LAB — BASIC METABOLIC PANEL
BUN: 85 mg/dL — ABNORMAL HIGH (ref 6–23)
CO2: 16 mmol/L — ABNORMAL LOW (ref 19–32)
Calcium: 11.5 mg/dL — ABNORMAL HIGH (ref 8.4–10.5)
Chloride: >130 mmol/L (ref 96–112)
Creatinine, Ser: 3.5 mg/dL — ABNORMAL HIGH (ref 0.50–1.10)
GFR calc non Af Amer: 11 mL/min — ABNORMAL LOW (ref >90)
GFR, EST AFRICAN AMERICAN: 13 mL/min — AB (ref >90)
GLUCOSE: 99 mg/dL (ref 70–99)
POTASSIUM: 4 mmol/L (ref 3.5–5.1)
Sodium: 167 mmol/L (ref 135–145)

## 2014-09-14 MED ORDER — SODIUM CHLORIDE 0.9 % IV SOLN
1250.0000 mg | Freq: Once | INTRAVENOUS | Status: AC
  Administered 2014-09-14: 1250 mg via INTRAVENOUS
  Filled 2014-09-14: qty 1250

## 2014-09-14 MED ORDER — SODIUM CHLORIDE 0.9 % IV SOLN
0.2000 mg/h | INTRAVENOUS | Status: DC
  Filled 2014-09-14: qty 20

## 2014-09-14 MED ORDER — LORAZEPAM 2 MG/ML IJ SOLN
0.5000 mg | INTRAMUSCULAR | Status: DC | PRN
  Administered 2014-09-14: 0.5 mg via INTRAVENOUS
  Filled 2014-09-14: qty 1

## 2014-09-14 MED ORDER — ACETAMINOPHEN 325 MG PO TABS: 650.0000 mg | ORAL_TABLET | Freq: Four times a day (QID) | ORAL | Status: DC | PRN

## 2014-09-14 MED ORDER — SODIUM CHLORIDE 0.9 % IV BOLUS (SEPSIS)
1000.0000 mL | Freq: Once | INTRAVENOUS | Status: AC
Start: 2014-09-14 — End: 2014-09-14
  Administered 2014-09-14: 1000 mL via INTRAVENOUS

## 2014-09-14 MED ORDER — SODIUM CHLORIDE 0.9 % IV BOLUS (SEPSIS)
1000.0000 mL | Freq: Once | INTRAVENOUS | Status: AC
  Administered 2014-09-14: 1000 mL via INTRAVENOUS

## 2014-09-14 MED ORDER — NOREPINEPHRINE BITARTRATE 1 MG/ML IV SOLN
0.0000 ug/min | Freq: Once | INTRAVENOUS | Status: AC
  Administered 2014-09-14: 5 ug/min via INTRAVENOUS
  Filled 2014-09-14: qty 4

## 2014-09-14 MED ORDER — DEXTROSE 5 % IV SOLN
2.0000 g | Freq: Once | INTRAVENOUS | Status: AC
  Administered 2014-09-14: 2 g via INTRAVENOUS
  Filled 2014-09-14: qty 2

## 2014-09-14 MED ORDER — ONDANSETRON HCL 4 MG/2ML IJ SOLN: 4.0000 mg | Freq: Four times a day (QID) | INTRAMUSCULAR | Status: DC | PRN

## 2014-09-14 MED ORDER — ACETAMINOPHEN 650 MG RE SUPP: 650.0000 mg | Freq: Four times a day (QID) | RECTAL | Status: DC | PRN

## 2014-09-14 MED ORDER — MORPHINE SULFATE 2 MG/ML IJ SOLN
1.0000 mg | Freq: Once | INTRAMUSCULAR | Status: AC
  Administered 2014-09-14: 1 mg via INTRAVENOUS
  Filled 2014-09-14: qty 1

## 2014-09-14 MED ORDER — ONDANSETRON HCL 4 MG PO TABS: 4.0000 mg | ORAL_TABLET | Freq: Four times a day (QID) | ORAL | Status: DC | PRN

## 2014-09-14 MED ORDER — SCOPOLAMINE 1 MG/3DAYS TD PT72
1.0000 | MEDICATED_PATCH | TRANSDERMAL | Status: DC
  Administered 2014-09-14: 1.5 mg via TRANSDERMAL
  Filled 2014-09-14: qty 1

## 2014-09-14 MED ORDER — MORPHINE SULFATE 2 MG/ML IJ SOLN
2.0000 mg | INTRAMUSCULAR | Status: DC | PRN
  Administered 2014-09-15 (×4): 2 mg via INTRAVENOUS
  Filled 2014-09-14 (×4): qty 1

## 2014-09-14 MED ORDER — LORAZEPAM 2 MG/ML IJ SOLN: 0.5000 mg | INTRAMUSCULAR | Status: DC | PRN

## 2014-09-14 MED ORDER — LORAZEPAM 2 MG/ML IJ SOLN: 1.0000 mg | INTRAMUSCULAR | Status: DC | PRN

## 2014-09-14 NOTE — ED Provider Notes (Signed)
CSN: [id number]     Arrival date & time 10-08-14  1746 History   First MD Initiated Contact with Patient 002-23-16 1748     Chief Complaint  Patient presents with  . Respiratory Distress     (Consider location/radiation/quality/duration/timing/severity/associated sxs/prior Treatment) HPI 81 year old female with past history is below [facility] for [facility] distress. Per report, EMS was called out for difficult breathing and on their arrival patient was noted to be minimally responsive with an initial O2 sat of 50%. Patient was placed on CPAP in route per EMS and her sats improved to the 70s.  In ED, family is present reports patient is DO NOT RESUSCITATE/DO NOT INTUBATE.  Nursing Home reported patient had decreased PO intake for the past 2 weeks. Family reports patient had gradual worsening of difficulty breathing throughout the day. No recent fever or other symptoms reported by family. Family does wish to have chest x-ray and labs drawn.  Family doesn't want any invasive procedures performed.     Past Medical History  Diagnosis Date  . Alzheimer's dementia   . Pneumonia   . Tachycardia   . Chronic renal insufficiency   . Hernia   . Hypertension   . Gout   . Acute kidney failure    Past Surgical History  Procedure Laterality Date  . Cholecystectomy    . Abdominal hysterectomy    . Colon surgery    . Stomach surgery     No family history on file. History  Substance Use Topics  . Smoking status: Former Games developermoker  . Smokeless tobacco: Not on file  . Alcohol Use: No   OB History    Gravida Para Term Preterm AB TAB SAB Ectopic Multiple Living            3     Review of Systems  unable to obtain secondary to patient condition   Allergies  Cephalosporins; Cephalosporins; Macrolides and ketolides; Macrolides and ketolides; Other; Penicillins; and Penicillins  Home Medications   Prior to Admission medications   Medication Sig Start Date End  Date Taking? Authorizing Provider  acetaminophen (TYLENOL) 500 MG tablet Take 500 mg by mouth every 4 (four) hours as needed for pain or fever.     Historical Provider, MD  acetaminophen (TYLENOL) 500 MG tablet Take 500 mg by mouth every 6 (six) hours as needed for headache.    Historical Provider, MD  acidophilus (RISAQUAD) CAPS capsule Take 1 capsule by mouth daily.  08/06/13   Historical Provider, MD  acidophilus (RISAQUAD) CAPS capsule Take 1 capsule by mouth daily.    Historical Provider, MD  alum & mag hydroxide-simeth (GERI-LANTA) 200-200-20 MG/5ML suspension Take 30 mLs by mouth 4 (four) times daily as needed for indigestion or heartburn.    Historical Provider, MD  alum & mag hydroxide-simeth (MAALOX/MYLANTA) 200-200-20 MG/5ML suspension Take 30 mLs by mouth every 6 (six) hours as needed for indigestion or heartburn.    Historical Provider, MD  amLODipine (NORVASC) 5 MG tablet Take 5 mg by mouth every morning.     Historical Provider, MD  amLODipine (NORVASC) 5 MG tablet Take 5 mg by mouth daily.    Historical Provider, MD  Artificial Tear Ointment (REFRESH LACRI-LUBE OP) Apply 1 application to eye 2 (two) times daily.    Historical Provider, MD  Artificial Tear Ointment (REFRESH LACRI-LUBE) OINT Place 1 strip into both eyes 2 (two) times daily.    Historical Provider, MD  calcium carbonate (OS-CAL)  600 MG TABS tablet Take 600 mg by mouth daily.    Historical Provider, MD  calcium carbonate (OS-CAL) 600 MG TABS Take 600 mg by mouth every morning.     Historical Provider, MD  Cholecalciferol (VITAMIN D-3) 5000 UNITS TABS Take 1 capsule by mouth daily.    Historical Provider, MD  Cholecalciferol (VITAMIN D3) 5000 UNITS TABS Take 5,000 Units by mouth daily with breakfast.     Historical Provider, MD  Cranberry 450 MG CAPS Take 1 capsule by mouth 2 (two) times daily.    Historical Provider, MD  Cranberry-Vitamin C-Probiotic (AZO CRANBERRY PO) Take 1 tablet by mouth 2 (two) times daily.     Historical Provider, MD  divalproex (DEPAKOTE SPRINKLE) 125 MG capsule Take 250 mg by mouth 4 (four) times daily.    Historical Provider, MD  divalproex (DEPAKOTE SPRINKLE) 125 MG capsule Take 250 mg by mouth 4 (four) times daily.    Historical Provider, MD  FLUoxetine (PROZAC) 10 MG capsule Take 10 mg by mouth daily. Take with 1 capsule orally every morning with 20 mg to equal  30 mg.    Historical Provider, MD  FLUoxetine (PROZAC) 10 MG capsule Take 10 mg by mouth daily. Add to 20mg  fluoxetine to equal 30mg  daily    Historical Provider, MD  FLUoxetine (PROZAC) 20 MG capsule Take 20 mg by mouth every morning. Take 1 capsule every morning with 10 mg to equal 30 mg.    Historical Provider, MD  FLUoxetine (PROZAC) 20 MG capsule Take 20 mg by mouth daily. Take along with 10mg  to equal 30mg     Historical Provider, MD  guaifenesin (Q-TUSSIN) 100 MG/5ML syrup Take 200 mg by mouth every 6 (six) hours as needed for cough.    Historical Provider, MD  HYDROcodone-acetaminophen (NORCO/VICODIN) 5-325 MG per tablet Take 1 tablet by mouth every 8 (eight) hours as needed for moderate pain.    Historical Provider, MD  HYDROcodone-acetaminophen (NORCO/VICODIN) 5-325 MG per tablet Take 1 tablet by mouth every 8 (eight) hours as needed for moderate pain.    Historical Provider, MD  hydroxypropyl methylcellulose (ISOPTO TEARS) 2.5 % ophthalmic solution Place 2 drops into both eyes 3 (three) times daily as needed for dry eyes.    Historical Provider, MD  hydroxypropyl methylcellulose / hypromellose (ISOPTO TEARS / GONIOVISC) 2.5 % ophthalmic solution Place 2 drops into both eyes 3 (three) times daily.    Historical Provider, MD  loperamide (IMODIUM A-D) 2 MG tablet Take 2 mg by mouth as needed for diarrhea or loose stools.     Historical Provider, MD  loperamide (IMODIUM) 2 MG capsule Take 2 mg by mouth as needed for diarrhea or loose stools.    Historical Provider, MD  loratadine (CLARITIN) 10 MG tablet Take 10 mg by  mouth daily after breakfast.    Historical Provider, MD  loratadine (CLARITIN) 10 MG tablet Take 10 mg by mouth daily.    Historical Provider, MD  LORazepam (ATIVAN) 0.5 MG tablet Take 0.5 mg by mouth every 6 (six) hours as needed (for agitation).    Historical Provider, MD  LORazepam (ATIVAN) 0.5 MG tablet Take 0.5 mg by mouth every 6 (six) hours as needed for anxiety.    Historical Provider, MD  magnesium hydroxide (MILK OF MAGNESIA) 400 MG/5ML suspension Take 30 mLs by mouth at bedtime as needed for constipation.     Historical Provider, MD  Memantine HCl ER (NAMENDA XR) 28 MG CP24 Take 1 capsule by mouth daily.  Historical Provider, MD  Memantine HCl ER (NAMENDA XR) 28 MG CP24 Take 1 capsule by mouth daily.    Historical Provider, MD  Menthol-Zinc Oxide (RISAMINE) 0.44-20.625 % OINT Apply 1 application topically as needed (for incontinence care).    Historical Provider, MD  Menthol-Zinc Oxide (RISAMINE) 0.44-20.625 % OINT Apply 1 application topically as needed (incontinence care).    Historical Provider, MD  mirtazapine (REMERON) 30 MG tablet Take 30 mg by mouth at bedtime.    Historical Provider, MD  mirtazapine (REMERON) 30 MG tablet Take 30 mg by mouth at bedtime.    Historical Provider, MD  Neomycin-Bacitracin-Polymyxin (TRIPLE ANTIBIOTIC) [phone] OINT Apply 1 application topically as needed (for wound care).    Historical Provider, MD  NUTRITIONAL SUPPLEMENT LIQD Take 1 Bottle by mouth 3 (three) times daily with meals. Mighty Shakes    Historical Provider, MD  Nutritional Supplements (NUTRITIONAL SUPPLEMENT PO) Take 1 Can by mouth 3 (three) times daily with meals. Mighty shakes    Historical Provider, MD  nystatin (MYCOSTATIN/NYSTOP) 100000 UNIT/GM POWD Apply 1 g topically 2 (two) times daily.    Historical Provider, MD  nystatin (MYCOSTATIN/NYSTOP) 100000 UNIT/GM POWD Apply 1 g topically 2 (two) times daily. To feet and interdigital spaces    Historical Provider, MD  OLANZapine  (ZYPREXA) 10 MG tablet Take 5 mg by mouth 2 (two) times daily.    Historical Provider, MD  OLANZapine (ZYPREXA) 10 MG tablet Take 5 mg by mouth 2 (two) times daily.    Historical Provider, MD  omeprazole (PRILOSEC) 20 MG capsule Take 20 mg by mouth 2 (two) times daily. At 0800 an 1600    Historical Provider, MD  omeprazole (PRILOSEC) 20 MG capsule Take 20 mg by mouth 2 (two) times daily before a meal.    Historical Provider, MD  Ostomy Supplies (SKIN PREP SPRAY) MISC Apply 1 application topically as needed (For wound care). Apply to left heel as needed for wound care    Historical Provider, MD  OVER THE COUNTER MEDICATION Take 1 application by mouth as needed (until wound is gone).    Historical Provider, MD  potassium chloride SA (K-DUR,KLOR-CON) 20 MEQ tablet Take 20 mEq by mouth every morning.     Historical Provider, MD  potassium chloride SA (K-DUR,KLOR-CON) 20 MEQ tablet Take 20 mEq by mouth daily.    Historical Provider, MD  PRESCRIPTION MEDICATION Apply 1 application topically daily. Geri-Sleeve: Use for upper left arm for skin tear    Historical Provider, MD  rivastigmine (EXELON) 9.5 mg/24hr Place 9.5 mg onto the skin daily.    Historical Provider, MD  rivastigmine (EXELON) 9.5 mg/24hr Place 9.5 mg onto the skin daily.    Historical Provider, MD  sulfamethoxazole-trimethoprim (BACTRIM DS) 800-160 MG per tablet Take 1 tablet by mouth 2 (two) times daily. 05/10/14   [name], PA-C  triamcinolone cream (KENALOG) 0.1 % Apply 1 application topically 2 (two) times daily as needed (for facial redness).    Historical Provider, MD  triamcinolone cream (KENALOG) 0.1 % Apply 1 application topically 2 (two) times daily as needed (for irritated skin).    Historical Provider, MD  vitamin B-12 (CYANOCOBALAMIN) 1000 MCG tablet Take 1,000 mcg by mouth every morning.     Historical Provider, MD  vitamin B-12 (CYANOCOBALAMIN) 1000 MCG tablet Take 1,000 mcg by mouth daily.    Historical Provider, MD   Wound Dressings (MEDIHONEY WOUND/BURN DRESSING) GEL Apply 1 application topically 3 (three) times a week. As needed for wound  care    Historical Provider, MD   BP 64/47 mmHg  Resp 45  SpO2  Physical Exam  Constitutional: She appears lethargic. She appears cachectic. She appears distressed. Face mask in place.  HENT:  Head: Normocephalic and atraumatic.  Mouth/Throat: Oropharynx is clear and moist.  Neck: Normal range of motion. Neck supple. No tracheal deviation present.  Cardiovascular: Regular rhythm, normal heart sounds and intact distal pulses.  Tachycardia present.   No murmur heard. Pulmonary/Chest: Accessory muscle usage present. Tachypnea noted. She is in respiratory distress. She has wheezes (scattered). She has rales in the left middle field and the left lower field. She exhibits no tenderness.  Abdominal: Soft. Normal appearance and bowel sounds are normal. She exhibits no distension. There is no tenderness.  Musculoskeletal: Normal range of motion.  Neurological: She appears lethargic. GCS eye subscore is 4. GCS verbal subscore is 2. GCS motor subscore is 4.  Skin: Skin is warm and dry.  Psychiatric: She has a normal mood and affect.  Nursing note and vitals reviewed.   ED Course  Procedures (including critical care time) Labs Review Labs Reviewed  BASIC METABOLIC PANEL - Abnormal; Notable for the following:    Sodium 167 (*)    Chloride >130 (*)    CO2 16 (*)    BUN 85 (*)    Creatinine, Ser 3.50 (*)    Calcium 11.5 (*)    GFR calc non Af Amer 11 (*)    GFR calc Af Amer 13 (*)    All other components within normal limits  I-STAT TROPOININ, ED - Abnormal; Notable for the following:    Troponin i, poc 0.12 (*)    All other components within normal limits  CBC WITH DIFFERENTIAL/PLATELET  CBC WITH DIFFERENTIAL/PLATELET    Imaging Review Dg Chest Portable 1 View  09/13/2014   CLINICAL DATA:  Dyspnea  EXAM: PORTABLE CHEST - 1 VIEW  COMPARISON:  April 19, 2014   FINDINGS: There is fairly extensive interstitial and patchy airspace consolidation throughout portions of the lingula and left lower lobe. The right lung is clear. Heart size and pulmonary vascularity are normal. No adenopathy. No bone lesions.  IMPRESSION: Extensive infiltrate on the left. Major differential considerations include pneumonia and aspiration pneumonitis. Right lung clear. No change in cardiac silhouette.   Electronically Signed   By: Bretta Bang M.D.   On: 08/17/2014 18:20     EKG Interpretation   Date/Time:  Saturday September 14 2014 17:53:55 EST Ventricular Rate:  146 PR Interval:  103 QRS Duration: 117 QT Interval:  306 QTC Calculation: 477 R Axis:   96 Text Interpretation:  Supraventricular tachycardia RBBB and LPFB  significant artifact noted which limits interpretation Confirmed by  Bebe Shaggy  MD, Dorinda Hill ([id number]) on 09/12/2014 6:10:01 PM      MDM   Final diagnoses:  None   On arrival patient had his initial sats in the mid 70s an initial blood pressure of 70s systolic. Nonrebreather was placed and patient had improvement to the 90s. Goals of care discussion was had with family and patient is DO NOT RESUSCITATE/DO NOT INTUBATE. Family wishes to prioritize comfort measures at this time. However, initiall family would like initial w/u performed and agree to chest x-ray and labs and treatment of PNA as indicated. 2 L of IV fluids were given to patient and blood pressure continued to be low. Long discussion with family regarding grim outlook.   Workup notable for left-sided pneumonia.  Clinical picture suggestive of  septic shock with multisystem organ failure  Including severe hypernatremia, ARF, demand ischemia.  . Family was okay with vasopressors being run peripherally but states they would not want any central venous access it it came to that. Following pressors patient's blood pressure initially responded well the required increased titration to maintain maps over 60.   Critical care was consulted and following lengthy discussions with the family they have opted for comfort measures only. Patient will be admitted to hospitalist.  Clinical Impression: 1. Hypernatremia   2. AKI (acute kidney injury)   3. Acute respiratory failure with hypoxia   4. HCAP (healthcare-associated pneumonia)   5. Alzheimer's dementia   6. Chronic renal insufficiency, unspecified stage   7. Essential hypertension    Pt seen in conjunction with Dr. Pecolia Ades, DO [facility] Emergency Medicine Resident - PGY-2     Ames Dura, MD 08/28/2014 [id number]  Joya Gaskins, MD 09/16/14 [phone]

## 2014-09-14 NOTE — ED Notes (Signed)
Per Intensive care MD, family has elected for comfort care only. Verbal orders to discontinue Levophed received.

## 2014-09-14 NOTE — ED Notes (Signed)
Family at bedside. Emotional support provided.

## 2014-09-14 NOTE — ED Notes (Signed)
[facility]. Per EMS pt has had decrease PO intake for past couple weeks. Pt has not had any PO intake past 3 days. Pulse ox for EMS 50-72%, pt placed on CPAP then BP dropped and pt switched to BVM by EMS. Daughter at bedside does not want pt to be intubated.

## 2014-09-14 NOTE — H&P (Signed)
[facility] History and Physical  Patient: Tanya Santana  MRN: [id number]  DOB: Apr 16, 1927  DOS: the patient was seen and examined on Sep 20, 2014 PCP: Kirstie Peri, MD  Chief Complaint: Shortness of breath  HPI: Tanya Santana is a 81 y.o. female with Past medical history of Alzheimer's dementia, hypertension, chronic kidney disease. The patient presented with complaints of shortness of breath. The history was obtained from the documentation. Patient is a resident of [facility]. Today patient was found to have change in mental status with shortness of breath. Patient has decreased by mouth intake for last couple of weeks and has no intake for last 3 days. Patient was found hypoxic and hypotension. No other symptoms reported. Patient was declared DNR/DNI on arrival.  The patient is coming from SNF. And at her baseline independent for most of her ADL.  Review of Systems: as mentioned in the history of present illness.  A Comprehensive review of the other systems is negative.  Past Medical History  Diagnosis Date  . Alzheimer's dementia   . Pneumonia   . Tachycardia   . Chronic renal insufficiency   . Hernia   . Hypertension   . Gout   . Acute kidney failure    Past Surgical History  Procedure Laterality Date  . Cholecystectomy    . Abdominal hysterectomy    . Colon surgery    . Stomach surgery     Social History:  reports that she has quit smoking. She does not have any smokeless tobacco history on file. She reports that she does not drink alcohol or use illicit drugs.  Allergies  Allergen Reactions  . Cephalosporins Other (See Comments)    Per MAR  . Macrolides And Ketolides Other (See Comments)    Per MAR  . Penicillins Other (See Comments)    Per MAR    No family history on file.  Prior to Admission medications   Medication Sig Start Date End Date Taking? Authorizing Provider  acetaminophen (TYLENOL) 500 MG tablet Take 500 mg by mouth every 4  (four) hours as needed for fever or headache (pain/fever up to 101 deg). Do not exceed 2000 mg in 24 hours, max dose of acetaminophen is 3000 mg in 24 hours from all sources   Yes Historical Provider, MD  Alum & Mag Hydroxide-Simeth (GERI-LANTA PO) Take 30 mLs by mouth 4 (four) times daily as needed (heartburn/indigestion).   Yes Historical Provider, MD  amLODipine (NORVASC) 5 MG tablet Take 5 mg by mouth daily.    Yes Historical Provider, MD  Cranberry-Vitamin C-Probiotic (AZO CRANBERRY PO) Take 1 tablet by mouth 2 (two) times daily.   Yes Historical Provider, MD  divalproex (DEPAKOTE SPRINKLE) 125 MG capsule Take 250 mg by mouth 4 (four) times daily. 8am, 12pm, 4pm, pm   Yes Historical Provider, MD  FLUoxetine (PROZAC) 10 MG capsule Take 10 mg by mouth daily. Take with 20 mg capsule for a 30 mg dose   Yes Historical Provider, MD  FLUoxetine (PROZAC) 20 MG capsule Take 20 mg by mouth daily. Take with a 10 mg capsule for a 30 mg dose   Yes Historical Provider, MD  guaifenesin (Q-TUSSIN) 100 MG/5ML syrup Take 200 mg by mouth every 6 (six) hours as needed for cough. Do not exceed 4 doses in 24 hours   Yes Historical Provider, MD  HYDROcodone-acetaminophen (NORCO/VICODIN) 5-325 MG per tablet Take 1 tablet by mouth every 8 (eight) hours as needed for moderate pain.   Yes Historical  Provider, MD  loperamide (IMODIUM) 2 MG capsule Take 2 mg by mouth as needed for diarrhea or loose stools. Do not exceed 8 doses in 24 hours   Yes Historical Provider, MD  loratadine (CLARITIN) 10 MG tablet Take 10 mg by mouth daily.   Yes Historical Provider, MD  LORazepam (ATIVAN) 0.5 MG tablet Take 0.5 mg by mouth every 6 (six) hours as needed (for agitation). May crush and put in applesauce - Do not exceed 4 tablets in 24 hours   Yes Historical Provider, MD  magnesium hydroxide (MILK OF MAGNESIA) 400 MG/5ML suspension Take 30 mLs by mouth at bedtime as needed (constipation).    Yes Historical Provider, MD  memantine (NAMENDA  XR) 14 MG CP24 24 hr capsule Take 14 mg by mouth daily.   Yes Historical Provider, MD  Menthol-Zinc Oxide (RISAMINE) 0.44-20.625 % OINT Apply 1 application topically See admin instructions. Apply topically to per area and buttocks with each incontinent change   Yes Historical Provider, MD  mirtazapine (REMERON) 30 MG tablet Take 30 mg by mouth at bedtime.   Yes Historical Provider, MD  OLANZapine (ZYPREXA) 10 MG tablet Take 5 mg by mouth 2 (two) times daily.   Yes Historical Provider, MD  omeprazole (PRILOSEC) 20 MG capsule Take 20 mg by mouth 2 (two) times daily. At 0800 an 1600   Yes Historical Provider, MD  Ostomy Supplies (SKIN PREP SPRAY) MISC Apply 1 application topically as needed (For wound care). Apply to left heel as needed for wound care   Yes Historical Provider, MD  Polyvinyl Alcohol (LIQUITEARS OP) Place 2 drops into both eyes 3 (three) times daily.   Yes Historical Provider, MD  PRESCRIPTION MEDICATION Apply 1 application topically daily. Geri-Sleeve: Use for upper left arm for skin tear   Yes Historical Provider, MD  rivastigmine (EXELON) 9.5 mg/24hr Place 9.5 mg onto the skin daily. Rotate site - do not use the same site within 14 days   Yes Historical Provider, MD  traZODone (DESYREL) 50 MG tablet Take 25 mg by mouth every 8 (eight) hours as needed (agitation).  07/10/14  Yes Historical Provider, MD  triamcinolone cream (KENALOG) 0.1 % Apply 1 application topically 2 (two) times daily as needed (facial redness).    Yes Historical Provider, MD  White Petrolatum-Mineral Oil (ARTIFICIAL TEARS) OINT ophthalmic ointment Place 1 application into both eyes 2 (two) times daily. Lubrifresh PM ointment   Yes Historical Provider, MD  Wound Dressings (MEDIHONEY WOUND/BURN DRESSING) GEL Apply 1 application topically as needed (3 times week as needed for wound care).    Yes Historical Provider, MD  sulfamethoxazole-trimethoprim (BACTRIM DS) 800-160 MG per tablet Take 1 tablet by mouth 2 (two) times  daily. Patient not taking: Reported on 08/23/2014 05/10/14   [name], PA-C    Physical Exam: Filed Vitals:   08/21/2014 2251 08/18/2014 2254 08/25/2014 2257 09/08/2014 2300  BP: 89/52 97/53 79/39  84/44  Resp: 33 33 40 41    General: Alert, Appear in mild distress ENT: Oral Mucosa clear dry. Cardiovascular: S1 and S2 Present, aortic systolic Murmur, Peripheral Pulses Present Respiratory: Bilateral Air entry equal and Decreased, right-sided Crackles, no wheezes Abdomen: Bowel Sound present Skin: no Rash Extremities: no Pedal edema,  Labs on Admission:  CBC: No results for input(s): WBC, NEUTROABS, HGB, HCT, MCV, PLT in the last 168 hours.  CMP     Component Value Date/Time   NA 167* 08/28/2014 1845   K 4.0 09/06/2014 1845   CL >  130* 08/26/2014 1845   CO2 16* 08/25/2014 1845   GLUCOSE 99 08/18/2014 1845   BUN 85* 08/16/2014 1845   CREATININE 3.50* 09/02/2014 1845   CALCIUM 11.5* 08/23/2014 1845   PROT 6.4 05/10/2014 0319   ALBUMIN 2.6* 05/10/2014 0319   AST 33 05/10/2014 0319   ALT 23 05/10/2014 0319   ALKPHOS 152* 05/10/2014 0319   BILITOT 0.4 05/10/2014 0319   GFRNONAA 11* 09/07/2014 1845   GFRAA 13* 09/03/2014 1845    No results for input(s): LIPASE, AMYLASE in the last 168 hours.  No results for input(s): CKTOTAL, CKMB, CKMBINDEX, TROPONINI in the last 168 hours. BNP (last 3 results) No results for input(s): PROBNP in the last 8760 hours.  Radiological Exams on Admission: Dg Chest Portable 1 View  08/20/2014   CLINICAL DATA:  Dyspnea  EXAM: PORTABLE CHEST - 1 VIEW  COMPARISON:  April 19, 2014  FINDINGS: There is fairly extensive interstitial and patchy airspace consolidation throughout portions of the lingula and left lower lobe. The right lung is clear. Heart size and pulmonary vascularity are normal. No adenopathy. No bone lesions.  IMPRESSION: Extensive infiltrate on the left. Major differential considerations include pneumonia and aspiration pneumonitis.  Right lung clear. No change in cardiac silhouette.   Electronically Signed   By: Bretta Bang M.D.   On: 09/10/2014 18:20    Assessment/Plan Principal Problem:   Septic shock Active Problems:   Alzheimer's dementia   Chronic renal insufficiency   Hypertension   HCAP (healthcare-associated pneumonia)   1. Septic shock The patient presented with respiratory distress hypotension and tachycardia fever. Family requested no invasive procedures and mention that the patient is DNR/DNI. Patient was initially discussed with critical care. After discussion with critical care the family decided to the patient to remain comfort care only without any aggressive measures. Hospitalist was consulted for admission for the patient. At the time of my evaluation the patient was groaning and was tachycardic. I ordered morphine and lorazepam. Patient will be admitted in the hospital for comfort measures. Will discuss with palliative care in the morning. No invasive procedures no blood work Diet as tolerated by patient. Oxygen as needed.  Advance goals of care discussion: DNR/DNI, comfort care   Consults: Palliative care  DVT Prophylaxis: Comfort care Nutrition: Diet for comfort  Family Communication: Family was present at bedside, opportunity was given to ask question and all questions were answered satisfactorily at the time of interview. Disposition: Admitted to inpatient in med-surge unit.  Author: Lynden Oxford, MD [facility] Pager: [phone] 08/27/2014, 11:31 PM    If 7PM-7AM, please contact night-coverage [email or web address] Password [email or web address]

## 2014-09-14 NOTE — Progress Notes (Signed)
Chaplain responded to page requesting family support as pt may be declining. Chaplain stood at bedside for 15-20 min with pt's daughter and granddaughter. They declined further chaplain support at this time. Page if needed.  Wille GlaserMcCray, Vola Beneke O, Chaplain 2015-06-29 6:43 PM

## 2014-09-14 NOTE — ED Provider Notes (Signed)
Patient seen on arrival with resident Pt here from nursing home for respiratory distress Daughter, Tanya Santana is at bedside.  She is POA Although she does not have a DNR form, but it is patient and her wishes not to be intubated Also - no CPR for this patient However, further testing and even CPAP/BiPAP are acceptable Pt is very ill appearing and daughter understands critical nature of situation   Joya Gaskinsonald W Winton Offord, MD August 01, 2015 1751

## 2014-09-14 NOTE — Consult Note (Signed)
PULMONARY / CRITICAL CARE MEDICINE   Name: Tanya Santana MRN: [id number] DOB: 05/12/1927    ADMISSION DATE:  08/21/2014 CONSULTATION DATE:  08/20/2014  REFERRING MD : ED  CHIEF COMPLAINT:  hypotension  INITIAL PRESENTATION: Advanced dementia patient from NH now with septic shock from Asp PNA, DNR. Going to comfort care   HISTORY OF PRESENT ILLNESS:  Basically a patient from NH with advanced dementia who has been refusing to eat or drink fvor about 1 week with occasional aspiration, has altered mental status for the past week. In the ED found to be hypotensive. Has PNA on CXR and renal failure. Abx given in ED and levophed was started for a brief period of time.  PAST MEDICAL HISTORY :   has a past medical history of Alzheimer's dementia; Pneumonia; Tachycardia; Chronic renal insufficiency; Hernia; Hypertension; Gout; and Acute kidney failure.  has past surgical history that includes Cholecystectomy; Abdominal hysterectomy; Colon surgery; and Stomach surgery. Prior to Admission medications   Medication Sig Start Date End Date Taking? Authorizing Provider  acetaminophen (TYLENOL) 500 MG tablet Take 500 mg by mouth every 4 (four) hours as needed for fever or headache (pain/fever up to 101 deg). Do not exceed 2000 mg in 24 hours, max dose of acetaminophen is 3000 mg in 24 hours from all sources   Yes Historical Provider, MD  Alum & Mag Hydroxide-Simeth (GERI-LANTA PO) Take 30 mLs by mouth 4 (four) times daily as needed (heartburn/indigestion).   Yes Historical Provider, MD  amLODipine (NORVASC) 5 MG tablet Take 5 mg by mouth daily.    Yes Historical Provider, MD  Cranberry-Vitamin C-Probiotic (AZO CRANBERRY PO) Take 1 tablet by mouth 2 (two) times daily.   Yes Historical Provider, MD  divalproex (DEPAKOTE SPRINKLE) 125 MG capsule Take 250 mg by mouth 4 (four) times daily. 8am, 12pm, 4pm, pm   Yes Historical Provider, MD  FLUoxetine (PROZAC) 10 MG capsule Take 10 mg by mouth daily. Take with  20 mg capsule for a 30 mg dose   Yes Historical Provider, MD  FLUoxetine (PROZAC) 20 MG capsule Take 20 mg by mouth daily. Take with a 10 mg capsule for a 30 mg dose   Yes Historical Provider, MD  guaifenesin (Q-TUSSIN) 100 MG/5ML syrup Take 200 mg by mouth every 6 (six) hours as needed for cough. Do not exceed 4 doses in 24 hours   Yes Historical Provider, MD  HYDROcodone-acetaminophen (NORCO/VICODIN) 5-325 MG per tablet Take 1 tablet by mouth every 8 (eight) hours as needed for moderate pain.   Yes Historical Provider, MD  loperamide (IMODIUM) 2 MG capsule Take 2 mg by mouth as needed for diarrhea or loose stools. Do not exceed 8 doses in 24 hours   Yes Historical Provider, MD  loratadine (CLARITIN) 10 MG tablet Take 10 mg by mouth daily.   Yes Historical Provider, MD  LORazepam (ATIVAN) 0.5 MG tablet Take 0.5 mg by mouth every 6 (six) hours as needed (for agitation). May crush and put in applesauce - Do not exceed 4 tablets in 24 hours   Yes Historical Provider, MD  magnesium hydroxide (MILK OF MAGNESIA) 400 MG/5ML suspension Take 30 mLs by mouth at bedtime as needed (constipation).    Yes Historical Provider, MD  memantine (NAMENDA XR) 14 MG CP24 24 hr capsule Take 14 mg by mouth daily.   Yes Historical Provider, MD  Menthol-Zinc Oxide (RISAMINE) 0.44-20.625 % OINT Apply 1 application topically See admin instructions. Apply topically to per area and buttocks  with each incontinent change   Yes Historical Provider, MD  mirtazapine (REMERON) 30 MG tablet Take 30 mg by mouth at bedtime.   Yes Historical Provider, MD  OLANZapine (ZYPREXA) 10 MG tablet Take 5 mg by mouth 2 (two) times daily.   Yes Historical Provider, MD  omeprazole (PRILOSEC) 20 MG capsule Take 20 mg by mouth 2 (two) times daily. At 0800 an 1600   Yes Historical Provider, MD  Ostomy Supplies (SKIN PREP SPRAY) MISC Apply 1 application topically as needed (For wound care). Apply to left heel as needed for wound care   Yes Historical  Provider, MD  Polyvinyl Alcohol (LIQUITEARS OP) Place 2 drops into both eyes 3 (three) times daily.   Yes Historical Provider, MD  PRESCRIPTION MEDICATION Apply 1 application topically daily. Geri-Sleeve: Use for upper left arm for skin tear   Yes Historical Provider, MD  rivastigmine (EXELON) 9.5 mg/24hr Place 9.5 mg onto the skin daily. Rotate site - do not use the same site within 14 days   Yes Historical Provider, MD  traZODone (DESYREL) 50 MG tablet Take 25 mg by mouth every 8 (eight) hours as needed (agitation).  07/10/14  Yes Historical Provider, MD  triamcinolone cream (KENALOG) 0.1 % Apply 1 application topically 2 (two) times daily as needed (facial redness).    Yes Historical Provider, MD  White Petrolatum-Mineral Oil (ARTIFICIAL TEARS) OINT ophthalmic ointment Place 1 application into both eyes 2 (two) times daily. Lubrifresh PM ointment   Yes Historical Provider, MD  Wound Dressings (MEDIHONEY WOUND/BURN DRESSING) GEL Apply 1 application topically as needed (3 times week as needed for wound care).    Yes Historical Provider, MD  sulfamethoxazole-trimethoprim (BACTRIM DS) 800-160 MG per tablet Take 1 tablet by mouth 2 (two) times daily. Patient not taking: Reported on 09/03/2014 05/10/14   Tanya A Kirichenko, PA-C   Allergies  Allergen Reactions  . Cephalosporins Other (See Comments)    Per MAR  . Macrolides And Ketolides Other (See Comments)    Per MAR  . Penicillins Other (See Comments)    Per MAR    FAMILY HISTORY:  has no family status information on file.  SOCIAL HISTORY:  reports that she has quit smoking. She does not have any smokeless tobacco history on file. She reports that she does not drink alcohol or use illicit drugs.  REVIEW OF SYSTEMS:  Cannot be obtained due to advanced dementia  SUBJECTIVE: appears in mild distress  VITAL SIGNS: Resp:  [24-49] 38 (01/30 2215) BP: (64-123)/(26-90) 95/37 mmHg (01/30 2215) HEMODYNAMICS:   VENTILATOR SETTINGS:   INTAKE  / OUTPUT:  Intake/Output Summary (Last 24 hours) at 09/05/2014 2251 Last data filed at 08/31/2014 1906  Gross per 24 hour  Intake   2000 ml  Output      0 ml  Net   2000 ml    PHYSICAL EXAMINATION: General:  Nonverbal, groans to pain Neuro:  Pupils equal and reactive, no preferential gaze HEENT:  Atraumatic, no stridor Cardiovascular:  RRR, no loud murmur Lungs:  Rhonchi bilaterally Abdomen:  Soft, nontender Musculoskeletal:  No gross deformities Skin:  Some scabs on lower extremities, no edema  LABS:  CBC No results for input(s): WBC, HGB, HCT, PLT in the last 168 hours. Coag's No results for input(s): APTT, INR in the last 168 hours. BMET  Recent Labs Lab 08/22/2014 1845  NA 167*  K 4.0  CL >130*  CO2 16*  BUN 85*  CREATININE 3.50*  GLUCOSE 99  Electrolytes  Recent Labs Lab 2014-09-25 1845  CALCIUM 11.5*   Sepsis Markers No results for input(s): LATICACIDVEN, PROCALCITON, O2SATVEN in the last 168 hours. ABG No results for input(s): PHART, PCO2ART, PO2ART in the last 168 hours. Liver Enzymes No results for input(s): AST, ALT, ALKPHOS, BILITOT, ALBUMIN in the last 168 hours. Cardiac Enzymes No results for input(s): TROPONINI, PROBNP in the last 168 hours. Glucose No results for input(s): GLUCAP in the last 168 hours.  Imaging No results found.   ASSESSMENT / PLAN: Basically this is an elderly patient with advanced dementia who likely aspirated and developed PNA with AKI and shock. I had a very long discussion with her daughters (POA) and other family members in the ED. They all agree that this is not an acceptable quality of life and that patient would not want any aggressive measures and would prefer full comfort care. I explained to them what that entails including pain medications, anticholinergics for secretion, and benzo for seizure or agitation. I explained to them that these medications may coincidentally shorten her life but they would provide the  necessary comfort and family members understand and accept it. Will not do any fluid or pressor. Oxygen may be used PRN. She is DNR. She will go to full inpatient hospice. I Have communicated this to the ED physician and patient's nurse.  We will sign off for now, call for any questions or assistance. Thank you   Wadie Lessen MD Pulmonary and Critical Care Medicine [facility] Pager: [phone]  09/25/14, 10:51 PM

## 2014-09-14 NOTE — ED Notes (Signed)
Family at bedside. Ice water given.

## 2014-09-14 NOTE — Progress Notes (Signed)
Pt placed on NRB, will not escalate to Bipap for now. MD at bedside. RT will continue to follow.

## 2014-09-15 DIAGNOSIS — F0280 Dementia in other diseases classified elsewhere without behavioral disturbance: Secondary | ICD-10-CM

## 2014-09-15 DIAGNOSIS — N179 Acute kidney failure, unspecified: Secondary | ICD-10-CM

## 2014-09-15 DIAGNOSIS — R6521 Severe sepsis with septic shock: Secondary | ICD-10-CM

## 2014-09-15 DIAGNOSIS — G309 Alzheimer's disease, unspecified: Secondary | ICD-10-CM

## 2014-09-15 DIAGNOSIS — A419 Sepsis, unspecified organism: Principal | ICD-10-CM

## 2014-09-15 DIAGNOSIS — J189 Pneumonia, unspecified organism: Secondary | ICD-10-CM

## 2014-09-16 NOTE — Progress Notes (Signed)
Called by family, patient stop breathing. No HR, RR , noted upon assessment, verified by another licensed nurse.Offered emotional support with the family. MD made aware of the death. [facility] made aware, patient not suitable for any organ donation with the referral number 09/14/[id number] spoke with [employer].

## 2014-09-16 NOTE — Discharge Summary (Addendum)
  Physician Discharge Summary  Charlann LangeJacqueline T Grout [id number] DOB: May 13, 1927 DOA: 09/13/2014  PCP: Kirstie PeriSHAH,ASHISH, MD  Admit date: 09/02/2014 Discharge date: 08-15-15  Time spent: 25  minutes   Patient expired on 08-15-15 at 9:15 am  Discharge Diagnoses:  Principal Problem:   Septic shock  Active Problems:   HCAP (healthcare-associated pneumonia)   UTI   Alzheimer's dementia   Chronic renal insufficiency   Hypertension      History of present illness:  81 y.o. female resident of [facility] with past medical history of Alzheimer's dementia, hypertension, chronic kidney disease. The patient presented with complaints of shortness of breath. The history was obtained from the documentation. [facility]. On the day of admission,  patient was found to have change in mental status with shortness of breath. Patient has decreased by mouth intake for last couple of weeks and has no intake for last 3 days. Patient was found to be hypoxic, tachypnic and hypotensive. Labs showed elevated troponin and  hypernatremia with AKI.  Critical care team consulted.  discussing with family , who wished patient to be made comfortable withotu any aggressive measures. Patient was made comfort care.  Hospital Course:  Septic shock  secondary to HCAP, UTI and acute dehydration. Patient's  family requested no invasive procedures or aggressive measures and wanted her to be comfortable. Pt placed on prn morphine and ativan for comfort.  She was minimally responsive to painful stimuli with shallow respiration during rounds this am. She was pronounced dead at 9:15 am. Family present at bedside.   Procedures:  NONE  Consultations:  PCCM  Discharge Exam: Filed Vitals:   2014/10/03 0002  BP: 77/47  Resp: 46        Allergies  Allergen Reactions  . Cephalosporins Other (See Comments)    Per MAR  . Macrolides And Ketolides Other (See Comments)    Per MAR  .  Penicillins Other (See Comments)    Per MAR      The results of significant diagnostics from this hospitalization (including imaging, microbiology, ancillary and laboratory) are listed below for reference.    Significant Diagnostic Studies: Dg Chest Portable 1 View  09/02/2014   CLINICAL DATA:  Dyspnea  EXAM: PORTABLE CHEST - 1 VIEW  COMPARISON:  April 19, 2014  FINDINGS: There is fairly extensive interstitial and patchy airspace consolidation throughout portions of the lingula and left lower lobe. The right lung is clear. Heart size and pulmonary vascularity are normal. No adenopathy. No bone lesions.  IMPRESSION: Extensive infiltrate on the left. Major differential considerations include pneumonia and aspiration pneumonitis. Right lung clear. No change in cardiac silhouette.   Electronically Signed   By: Bretta BangWilliam  Woodruff M.D.   On: 09/02/2014 18:20    Microbiology: No results found for this or any previous visit (from the past 240 hour(s)).   Labs: Basic Metabolic Panel:  Recent Labs Lab 08/23/2014 1845  NA 167*  K 4.0  CL >130*  CO2 16*  GLUCOSE 99  BUN 85*  CREATININE 3.50*  CALCIUM 11.5*       Signed:  Mckenzy Salazar  [facility] 08-15-15, 9:39 AM

## 2014-09-16 NOTE — Progress Notes (Signed)
Per security patients body needs to be bagged . Patients family made aware.

## 2014-09-16 NOTE — Progress Notes (Signed)
Family refused for the blood work to be drawn/ [employer] [facility]

## 2014-09-16 NOTE — Progress Notes (Signed)
Offered post mortem care, pateint family refused and stated they will be the one to clean patient, refused body to be bagged. Waiting for the funeral to pick patients body.

## 2014-09-16 NOTE — Progress Notes (Signed)
[facility] released  [facility] [place]

## 2014-09-16 NOTE — Progress Notes (Signed)
Utilization review completed.  

## 2014-09-16 DEATH — deceased

## 2015-09-23 IMAGING — CT CT CERVICAL SPINE W/O CM
3 of 6 series · 11 of 33 positions shown, 13 images · non-contrast
Comparison: 09/30/2013 in [DATE], 01/24/2013.

CLINICAL DATA: 87-year-old patient with dementia. Found face down
on the ground.

EXAM:
CT HEAD WITHOUT CONTRAST
CT CERVICAL SPINE WITHOUT CONTRAST
TECHNIQUE: Multidetector CT imaging of the head and cervical spine was
performed following the standard protocol without intravenous
contrast. Multiplanar CT image reconstructions of the cervical spine
were also generated.

[Series 304: orthog · axial · 0.31mm/px · z∈[+123,+215]mm · 3 of 99 slices shown, 4 images]
[im 25/99  soft-tissue]
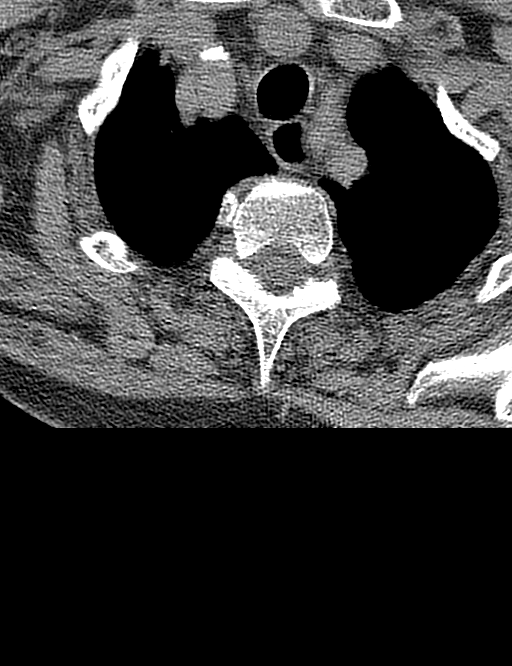
[im 25/99  bone]
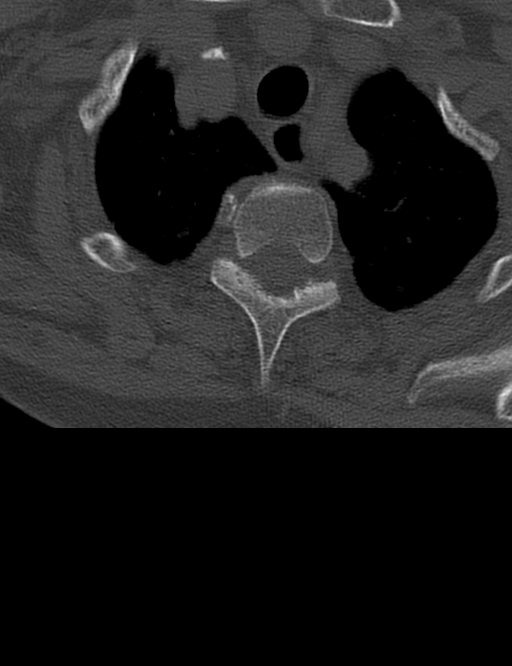
[im 50/99  bone]
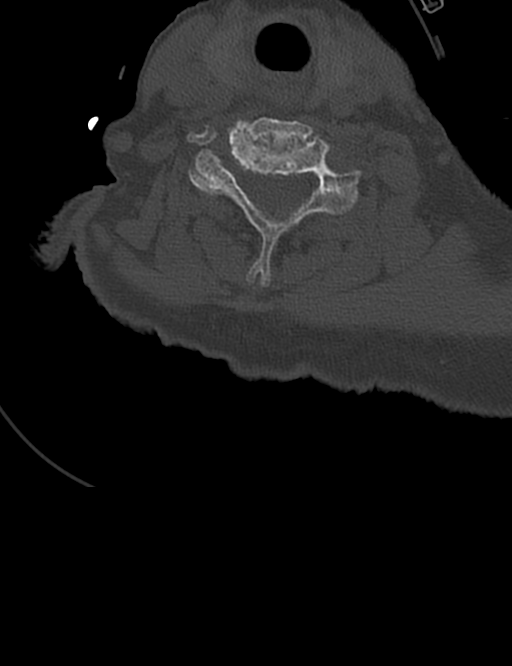
[im 74/99  bone]
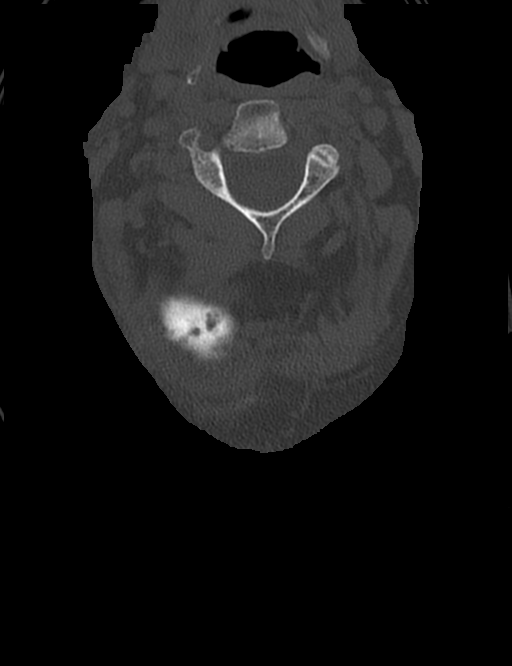

[Series 305: cor · coronal · 0.31mm/px · 3 of 36 slices shown]
[im 8/36  bone]
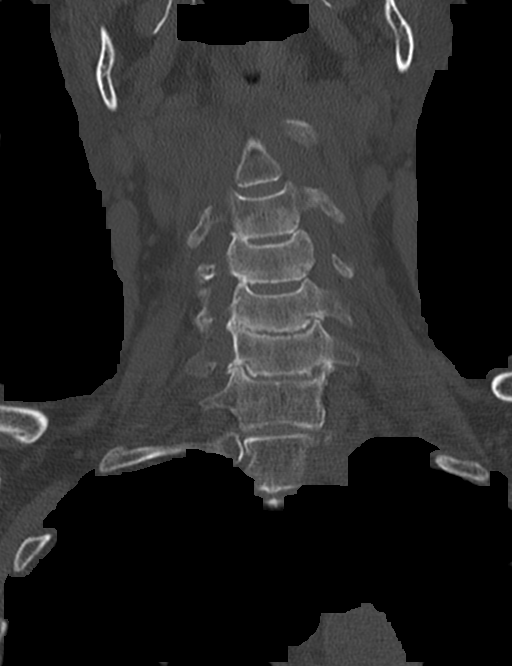
[im 15/36  bone]
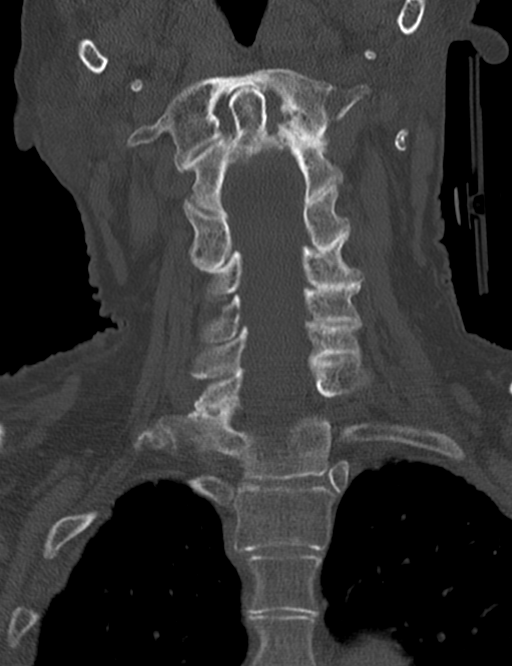
[im 22/36  bone]
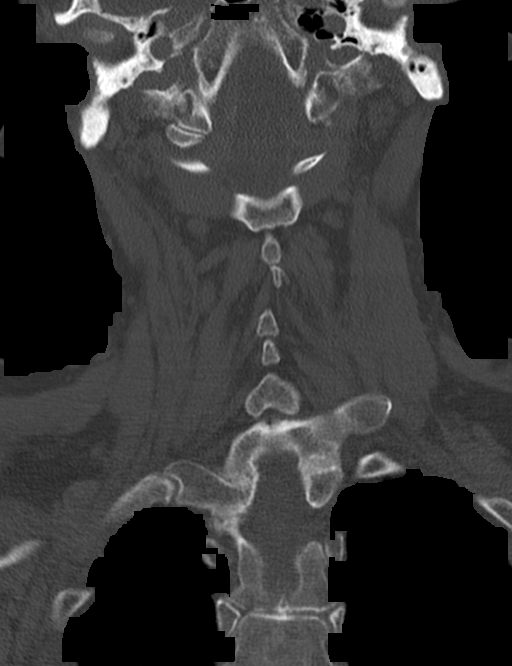

[Series 306: sag · sagittal · 0.31mm/px · 5 of 45 slices shown, 6 images]
[im 15/45  bone]
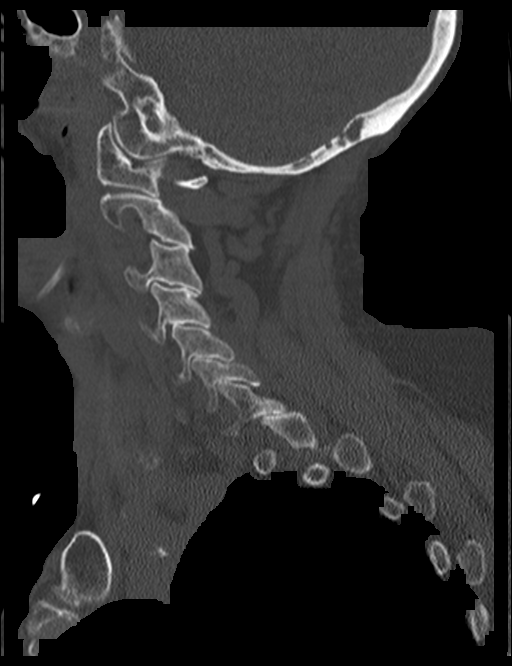
[im 19/45  bone]
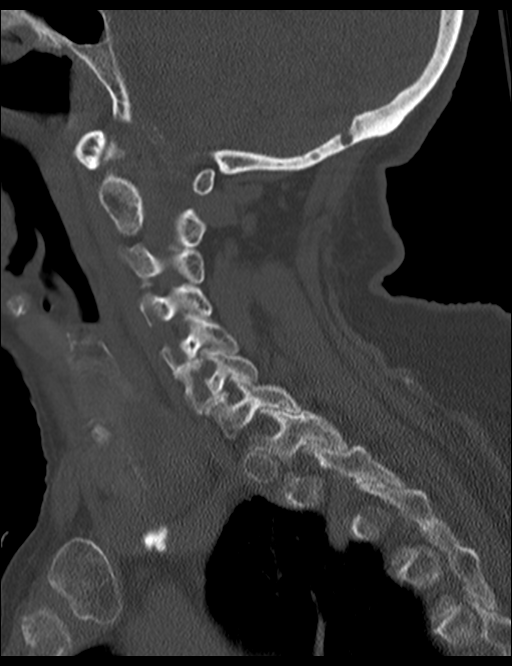
[im 23/45  soft-tissue]
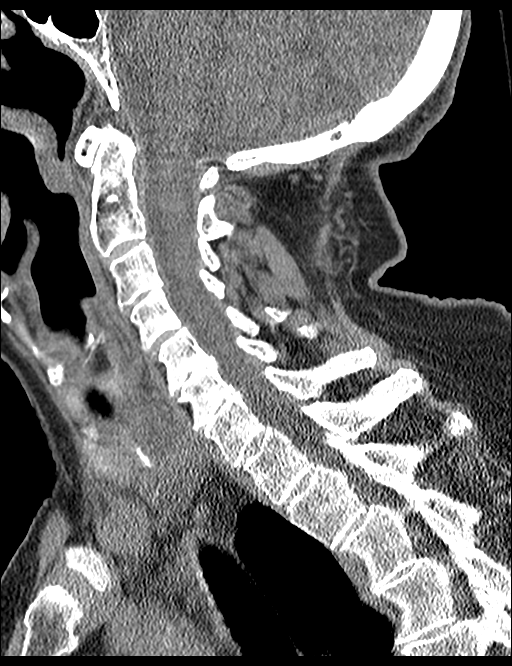
[im 23/45  bone]
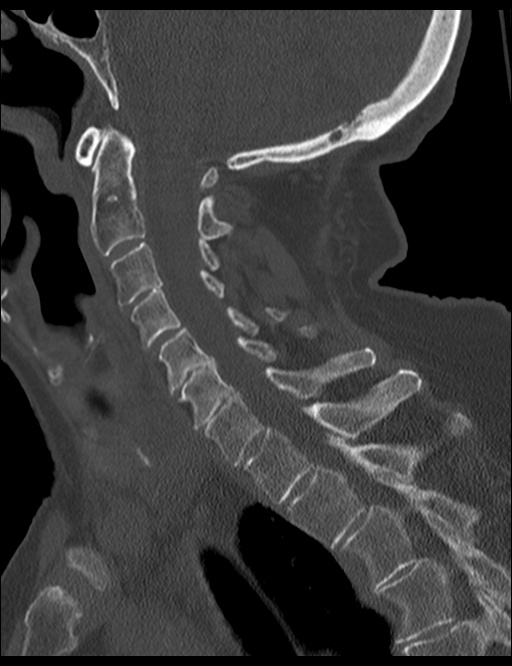
[im 26/45  bone]
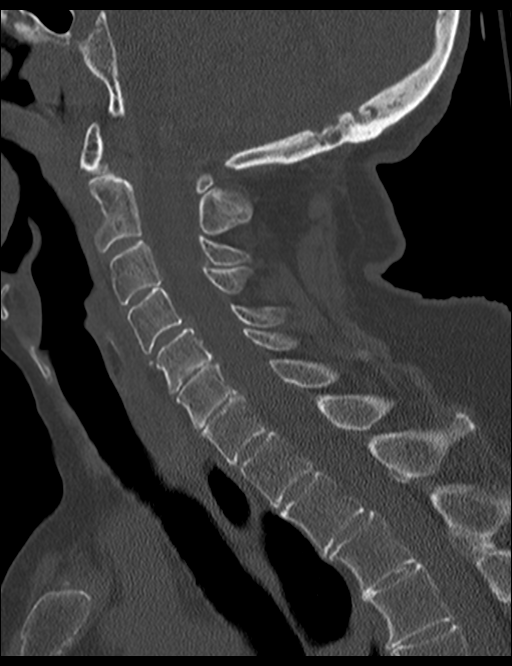
[im 30/45  bone]
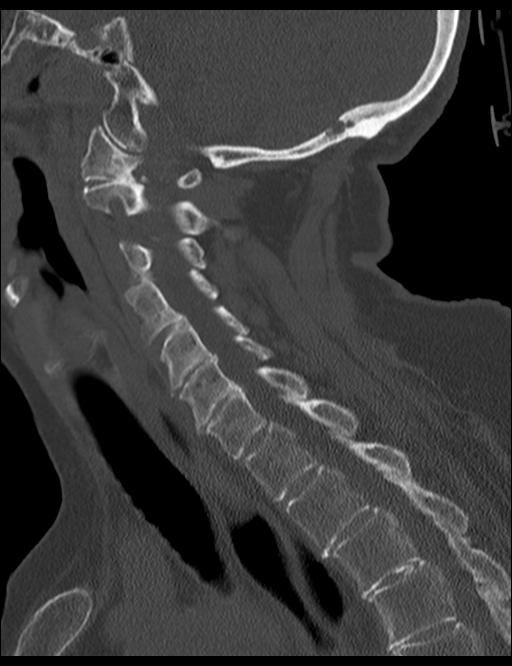

[11 of 33 positions shown; findings below may reference images not displayed]

FINDINGS: CT HEAD FINDINGS

There is a small defect in the skin surface of the left forehead
superiorly. Additionally, there is a bandage and focal soft tissue
swelling the level of the left supraorbital rim. The skull is
intact. Multiple benign appearing rounded lucencies in the occipital
skull are unchanged dating back to a least 1848. No evidence of
skull fracture.

There is moderate chronic microvascular ischemic change in the
periventricular white matter bilaterally. Negative for hemorrhage,
hydrocephalus, mass effect, mass lesion, or evidence of acute
cortically based infarction.

CT CERVICAL SPINE FINDINGS

The cervical spine is aligned from the skullbase through the T4-T5
junction. There is disc height narrowing at C5-6 and C6-7. Negative
for fracture. Prevertebral soft tissue contour is normal.

Nodular enlarged and heterogeneous right lobe of the thyroid with
associated calcification is stable compared to prior cervical spine
CT of January 2013.
IMPRESSION: 1. Small defect in the skin of the left frontal scalp and mild focal
soft tissue swelling with overlying bandage material in the region
of the left supraorbital rim.
2. No acute intracranial abnormality.
3. Moderate chronic periventricular white matter disease.
4. No evidence of acute bony cervical spine injury.
